# Patient Record
Sex: Female | Born: 1960
Health system: Southern US, Community
[De-identification: ages and names within clinical notes are randomized; demographics above are authoritative.]

## PROBLEM LIST (undated history)

## (undated) DIAGNOSIS — Z923 Personal history of irradiation: Secondary | ICD-10-CM

## (undated) DIAGNOSIS — Z9221 Personal history of antineoplastic chemotherapy: Secondary | ICD-10-CM

## (undated) DIAGNOSIS — L900 Lichen sclerosus et atrophicus: Secondary | ICD-10-CM

## (undated) DIAGNOSIS — C801 Malignant (primary) neoplasm, unspecified: Secondary | ICD-10-CM

## (undated) DIAGNOSIS — C50919 Malignant neoplasm of unspecified site of unspecified female breast: Secondary | ICD-10-CM

## (undated) DIAGNOSIS — Z1371 Encounter for nonprocreative screening for genetic disease carrier status: Secondary | ICD-10-CM

## (undated) HISTORY — DX: Malignant (primary) neoplasm, unspecified: C80.1

## (undated) HISTORY — DX: Encounter for nonprocreative screening for genetic disease carrier status: Z13.71

## (undated) HISTORY — DX: Lichen sclerosus et atrophicus: L90.0

## (undated) HISTORY — PX: BREAST LUMPECTOMY: SHX2

---

## 1998-08-25 ENCOUNTER — Other Ambulatory Visit: Admission: RE | Admit: 1998-08-25 | Discharge: 1998-08-25 | Payer: Self-pay | Admitting: Gynecology

## 1998-08-26 ENCOUNTER — Encounter: Admission: RE | Admit: 1998-08-26 | Discharge: 1998-11-24 | Payer: Self-pay | Admitting: Gynecology

## 2000-10-10 ENCOUNTER — Other Ambulatory Visit: Admission: RE | Admit: 2000-10-10 | Discharge: 2000-10-10 | Payer: Self-pay | Admitting: Gynecology

## 2002-07-08 ENCOUNTER — Other Ambulatory Visit: Admission: RE | Admit: 2002-07-08 | Discharge: 2002-07-08 | Payer: Self-pay | Admitting: Gynecology

## 2003-08-12 ENCOUNTER — Other Ambulatory Visit: Admission: RE | Admit: 2003-08-12 | Discharge: 2003-08-12 | Payer: Self-pay | Admitting: Gynecology

## 2004-01-11 HISTORY — PX: BREAST SURGERY: SHX581

## 2004-06-10 DIAGNOSIS — C801 Malignant (primary) neoplasm, unspecified: Secondary | ICD-10-CM

## 2004-06-10 HISTORY — DX: Malignant (primary) neoplasm, unspecified: C80.1

## 2004-07-01 ENCOUNTER — Encounter (INDEPENDENT_AMBULATORY_CARE_PROVIDER_SITE_OTHER): Payer: Self-pay | Admitting: Radiology

## 2004-07-01 ENCOUNTER — Encounter: Admission: RE | Admit: 2004-07-01 | Discharge: 2004-07-01 | Payer: Self-pay | Admitting: General Surgery

## 2004-07-01 ENCOUNTER — Encounter (INDEPENDENT_AMBULATORY_CARE_PROVIDER_SITE_OTHER): Payer: Self-pay | Admitting: *Deleted

## 2004-07-07 ENCOUNTER — Encounter: Admission: RE | Admit: 2004-07-07 | Discharge: 2004-07-07 | Payer: Self-pay | Admitting: General Surgery

## 2004-07-16 ENCOUNTER — Encounter: Admission: RE | Admit: 2004-07-16 | Discharge: 2004-07-16 | Payer: Self-pay | Admitting: General Surgery

## 2004-07-22 ENCOUNTER — Ambulatory Visit (HOSPITAL_COMMUNITY): Admission: RE | Admit: 2004-07-22 | Discharge: 2004-07-22 | Payer: Self-pay | Admitting: General Surgery

## 2004-07-22 ENCOUNTER — Ambulatory Visit: Payer: Self-pay | Admitting: Oncology

## 2004-07-22 ENCOUNTER — Encounter (INDEPENDENT_AMBULATORY_CARE_PROVIDER_SITE_OTHER): Payer: Self-pay | Admitting: *Deleted

## 2004-07-22 ENCOUNTER — Ambulatory Visit (HOSPITAL_BASED_OUTPATIENT_CLINIC_OR_DEPARTMENT_OTHER): Admission: RE | Admit: 2004-07-22 | Discharge: 2004-07-22 | Payer: Self-pay | Admitting: General Surgery

## 2004-08-09 ENCOUNTER — Ambulatory Visit (HOSPITAL_BASED_OUTPATIENT_CLINIC_OR_DEPARTMENT_OTHER): Admission: RE | Admit: 2004-08-09 | Discharge: 2004-08-10 | Payer: Self-pay | Admitting: General Surgery

## 2004-08-09 ENCOUNTER — Ambulatory Visit (HOSPITAL_COMMUNITY): Admission: RE | Admit: 2004-08-09 | Discharge: 2004-08-09 | Payer: Self-pay | Admitting: General Surgery

## 2004-08-09 ENCOUNTER — Encounter (INDEPENDENT_AMBULATORY_CARE_PROVIDER_SITE_OTHER): Payer: Self-pay | Admitting: Specialist

## 2004-08-31 ENCOUNTER — Other Ambulatory Visit: Admission: RE | Admit: 2004-08-31 | Discharge: 2004-08-31 | Payer: Self-pay | Admitting: Gynecology

## 2004-09-02 ENCOUNTER — Ambulatory Visit (HOSPITAL_COMMUNITY): Admission: RE | Admit: 2004-09-02 | Discharge: 2004-09-02 | Payer: Self-pay | Admitting: Oncology

## 2004-09-16 ENCOUNTER — Ambulatory Visit: Payer: Self-pay | Admitting: Oncology

## 2004-11-01 ENCOUNTER — Ambulatory Visit: Payer: Self-pay | Admitting: Oncology

## 2004-12-17 ENCOUNTER — Ambulatory Visit: Payer: Self-pay | Admitting: Oncology

## 2004-12-21 ENCOUNTER — Ambulatory Visit: Admission: RE | Admit: 2004-12-21 | Discharge: 2004-12-31 | Payer: Self-pay | Admitting: Radiation Oncology

## 2005-01-14 ENCOUNTER — Encounter: Admission: RE | Admit: 2005-01-14 | Discharge: 2005-01-14 | Payer: Self-pay | Admitting: Radiation Oncology

## 2005-01-19 ENCOUNTER — Ambulatory Visit: Admission: RE | Admit: 2005-01-19 | Discharge: 2005-03-23 | Payer: Self-pay | Admitting: Radiation Oncology

## 2005-01-25 ENCOUNTER — Ambulatory Visit (HOSPITAL_BASED_OUTPATIENT_CLINIC_OR_DEPARTMENT_OTHER): Admission: RE | Admit: 2005-01-25 | Discharge: 2005-01-25 | Payer: Self-pay | Admitting: Women's Health

## 2005-02-15 ENCOUNTER — Ambulatory Visit: Payer: Self-pay | Admitting: Oncology

## 2005-05-16 ENCOUNTER — Ambulatory Visit: Payer: Self-pay | Admitting: Oncology

## 2005-05-16 LAB — COMPREHENSIVE METABOLIC PANEL
ALT: 12 U/L (ref 0–40)
AST: 18 U/L (ref 0–37)
Creatinine, Ser: 0.9 mg/dL (ref 0.4–1.2)
Total Bilirubin: 0.4 mg/dL (ref 0.3–1.2)

## 2005-05-16 LAB — CBC & DIFF AND RETIC
BASO%: 0.2 % (ref 0.0–2.0)
EOS%: 1.5 % (ref 0.0–7.0)
IRF: 0.2 (ref 0.130–0.330)
MCH: 28.8 pg (ref 26.0–34.0)
MCHC: 33.9 g/dL (ref 32.0–36.0)
RBC: 4.17 10*6/uL (ref 3.70–5.32)
RDW: 14.9 % — ABNORMAL HIGH (ref 11.3–14.5)
RETIC #: 70.1 10*3/uL (ref 19.7–115.1)
lymph#: 1.3 10*3/uL (ref 0.9–3.3)

## 2005-05-16 LAB — CANCER ANTIGEN 27.29: CA 27.29: 10 U/mL (ref 0–39)

## 2005-05-16 LAB — ESTRADIOL: Estradiol: 28.2 pg/mL

## 2005-05-16 LAB — FOLLICLE STIMULATING HORMONE: FSH: 51.5 m[IU]/mL

## 2005-07-18 ENCOUNTER — Encounter: Admission: RE | Admit: 2005-07-18 | Discharge: 2005-07-18 | Payer: Self-pay | Admitting: Oncology

## 2005-07-21 ENCOUNTER — Encounter: Admission: RE | Admit: 2005-07-21 | Discharge: 2005-07-21 | Payer: Self-pay | Admitting: Oncology

## 2005-08-02 ENCOUNTER — Ambulatory Visit: Payer: Self-pay | Admitting: Oncology

## 2005-08-08 LAB — CBC WITH DIFFERENTIAL/PLATELET
Basophils Absolute: 0 10*3/uL (ref 0.0–0.1)
Eosinophils Absolute: 0.1 10*3/uL (ref 0.0–0.5)
HGB: 12.4 g/dL (ref 11.6–15.9)
MCV: 86 fL (ref 81.0–101.0)
MONO#: 0.3 10*3/uL (ref 0.1–0.9)
MONO%: 6.2 % (ref 0.0–13.0)
NEUT#: 2.9 10*3/uL (ref 1.5–6.5)
RDW: 12.4 % (ref 11.3–14.5)
lymph#: 1.3 10*3/uL (ref 0.9–3.3)

## 2005-08-13 LAB — COMPREHENSIVE METABOLIC PANEL
Albumin: 4.6 g/dL (ref 3.5–5.2)
BUN: 18 mg/dL (ref 6–23)
CO2: 28 mEq/L (ref 19–32)
Calcium: 9.6 mg/dL (ref 8.4–10.5)
Chloride: 104 mEq/L (ref 96–112)
Glucose, Bld: 92 mg/dL (ref 70–99)
Potassium: 4.3 mEq/L (ref 3.5–5.3)

## 2005-08-13 LAB — CANCER ANTIGEN 27.29: CA 27.29: 9 U/mL (ref 0–39)

## 2005-08-13 LAB — FOLLICLE STIMULATING HORMONE: FSH: 43.2 m[IU]/mL

## 2005-08-13 LAB — ESTRADIOL, ULTRA SENS: Estradiol, Ultra Sensitive: 51 pg/mL

## 2005-09-26 ENCOUNTER — Ambulatory Visit: Payer: Self-pay | Admitting: Oncology

## 2005-10-26 ENCOUNTER — Other Ambulatory Visit: Admission: RE | Admit: 2005-10-26 | Discharge: 2005-10-26 | Payer: Self-pay | Admitting: Gynecology

## 2005-11-10 ENCOUNTER — Ambulatory Visit: Payer: Self-pay | Admitting: Oncology

## 2005-11-14 LAB — COMPREHENSIVE METABOLIC PANEL
AST: 16 U/L (ref 0–37)
BUN: 17 mg/dL (ref 6–23)
Calcium: 8.7 mg/dL (ref 8.4–10.5)
Chloride: 104 mEq/L (ref 96–112)
Creatinine, Ser: 1.03 mg/dL (ref 0.40–1.20)
Glucose, Bld: 81 mg/dL (ref 70–99)

## 2005-11-14 LAB — CBC WITH DIFFERENTIAL/PLATELET
Basophils Absolute: 0 10*3/uL (ref 0.0–0.1)
EOS%: 1.4 % (ref 0.0–7.0)
Eosinophils Absolute: 0.1 10*3/uL (ref 0.0–0.5)
HCT: 35.7 % (ref 34.8–46.6)
HGB: 12.2 g/dL (ref 11.6–15.9)
MCH: 29.3 pg (ref 26.0–34.0)
MCV: 85.9 fL (ref 81.0–101.0)
NEUT%: 71.8 % (ref 39.6–76.8)
lymph#: 1.2 10*3/uL (ref 0.9–3.3)

## 2005-12-12 ENCOUNTER — Encounter: Admission: RE | Admit: 2005-12-12 | Discharge: 2005-12-12 | Payer: Self-pay | Admitting: Oncology

## 2006-02-10 ENCOUNTER — Ambulatory Visit: Payer: Self-pay | Admitting: Oncology

## 2006-02-15 LAB — CBC WITH DIFFERENTIAL/PLATELET
BASO%: 0.3 % (ref 0.0–2.0)
Basophils Absolute: 0 10*3/uL (ref 0.0–0.1)
Eosinophils Absolute: 0.1 10*3/uL (ref 0.0–0.5)
HCT: 34 % — ABNORMAL LOW (ref 34.8–46.6)
HGB: 11.8 g/dL (ref 11.6–15.9)
LYMPH%: 26.5 % (ref 14.0–48.0)
MCHC: 34.8 g/dL (ref 32.0–36.0)
MONO#: 0.3 10*3/uL (ref 0.1–0.9)
NEUT#: 3.4 10*3/uL (ref 1.5–6.5)
NEUT%: 66.9 % (ref 39.6–76.8)
Platelets: 264 10*3/uL (ref 145–400)
WBC: 5 10*3/uL (ref 3.9–10.0)
lymph#: 1.3 10*3/uL (ref 0.9–3.3)

## 2006-02-15 LAB — COMPREHENSIVE METABOLIC PANEL
AST: 14 U/L (ref 0–37)
Albumin: 4.2 g/dL (ref 3.5–5.2)
BUN: 16 mg/dL (ref 6–23)
CO2: 27 mEq/L (ref 19–32)
Calcium: 9.1 mg/dL (ref 8.4–10.5)
Chloride: 105 mEq/L (ref 96–112)
Creatinine, Ser: 0.91 mg/dL (ref 0.40–1.20)
Potassium: 4 mEq/L (ref 3.5–5.3)

## 2006-05-12 ENCOUNTER — Ambulatory Visit: Payer: Self-pay | Admitting: Oncology

## 2006-08-09 ENCOUNTER — Encounter: Admission: RE | Admit: 2006-08-09 | Discharge: 2006-08-09 | Payer: Self-pay | Admitting: Oncology

## 2006-08-10 ENCOUNTER — Ambulatory Visit: Payer: Self-pay | Admitting: Oncology

## 2006-08-15 LAB — CBC WITH DIFFERENTIAL/PLATELET
BASO%: 0.3 % (ref 0.0–2.0)
Basophils Absolute: 0 10*3/uL (ref 0.0–0.1)
EOS%: 1.2 % (ref 0.0–7.0)
HCT: 35.1 % (ref 34.8–46.6)
HGB: 12.2 g/dL (ref 11.6–15.9)
MCH: 29.2 pg (ref 26.0–34.0)
MCHC: 34.7 g/dL (ref 32.0–36.0)
MONO#: 0.4 10*3/uL (ref 0.1–0.9)
RDW: 12.9 % (ref 11.3–14.5)
WBC: 5.4 10*3/uL (ref 3.9–10.0)
lymph#: 1.4 10*3/uL (ref 0.9–3.3)

## 2006-08-15 LAB — COMPREHENSIVE METABOLIC PANEL
ALT: 10 U/L (ref 0–35)
AST: 13 U/L (ref 0–37)
Albumin: 4.3 g/dL (ref 3.5–5.2)
CO2: 26 mEq/L (ref 19–32)
Calcium: 8.8 mg/dL (ref 8.4–10.5)
Chloride: 105 mEq/L (ref 96–112)
Potassium: 4.2 mEq/L (ref 3.5–5.3)

## 2006-08-15 LAB — CANCER ANTIGEN 27.29: CA 27.29: 5 U/mL (ref 0–39)

## 2006-08-23 LAB — ESTRADIOL, ULTRA SENS: Estradiol, Ultra Sensitive: 9 pg/mL

## 2006-11-20 ENCOUNTER — Other Ambulatory Visit: Admission: RE | Admit: 2006-11-20 | Discharge: 2006-11-20 | Payer: Self-pay | Admitting: Gynecology

## 2006-12-15 ENCOUNTER — Encounter: Admission: RE | Admit: 2006-12-15 | Discharge: 2006-12-15 | Payer: Self-pay | Admitting: Oncology

## 2007-04-12 ENCOUNTER — Ambulatory Visit: Payer: Self-pay | Admitting: Oncology

## 2007-04-17 LAB — CBC WITH DIFFERENTIAL/PLATELET
BASO%: 0.5 % (ref 0.0–2.0)
EOS%: 1.1 % (ref 0.0–7.0)
HCT: 34.7 % — ABNORMAL LOW (ref 34.8–46.6)
LYMPH%: 26.8 % (ref 14.0–48.0)
MCH: 28.8 pg (ref 26.0–34.0)
MCHC: 34.1 g/dL (ref 32.0–36.0)
MCV: 84.3 fL (ref 81.0–101.0)
MONO#: 0.4 10*3/uL (ref 0.1–0.9)
MONO%: 5.9 % (ref 0.0–13.0)
NEUT%: 65.7 % (ref 39.6–76.8)
Platelets: 245 10*3/uL (ref 145–400)

## 2007-04-17 LAB — COMPREHENSIVE METABOLIC PANEL
ALT: 12 U/L (ref 0–35)
Alkaline Phosphatase: 60 U/L (ref 39–117)
CO2: 28 mEq/L (ref 19–32)
Creatinine, Ser: 1.02 mg/dL (ref 0.40–1.20)
Total Bilirubin: 0.3 mg/dL (ref 0.3–1.2)

## 2007-04-17 LAB — CANCER ANTIGEN 27.29: CA 27.29: 11 U/mL (ref 0–39)

## 2007-06-11 HISTORY — PX: COLONOSCOPY: SHX174

## 2007-10-16 ENCOUNTER — Ambulatory Visit: Payer: Self-pay | Admitting: Oncology

## 2007-10-18 LAB — CBC WITH DIFFERENTIAL/PLATELET
BASO%: 0.3 % (ref 0.0–2.0)
Basophils Absolute: 0 10e3/uL (ref 0.0–0.1)
EOS%: 1.2 % (ref 0.0–7.0)
Eosinophils Absolute: 0.1 10e3/uL (ref 0.0–0.5)
HCT: 35.9 % (ref 34.8–46.6)
HGB: 12.3 g/dL (ref 11.6–15.9)
LYMPH%: 26.4 % (ref 14.0–48.0)
MCH: 29.2 pg (ref 26.0–34.0)
MCHC: 34.4 g/dL (ref 32.0–36.0)
MCV: 84.9 fL (ref 81.0–101.0)
MONO#: 0.3 10e3/uL (ref 0.1–0.9)
MONO%: 5.4 % (ref 0.0–13.0)
NEUT#: 3.8 10e3/uL (ref 1.5–6.5)
NEUT%: 66.7 % (ref 39.6–76.8)
Platelets: 230 10e3/uL (ref 145–400)
RBC: 4.22 10e6/uL (ref 3.70–5.32)
RDW: 13 % (ref 11.3–14.5)
WBC: 5.7 10e3/uL (ref 3.9–10.0)
lymph#: 1.5 10e3/uL (ref 0.9–3.3)

## 2007-10-19 LAB — COMPREHENSIVE METABOLIC PANEL
CO2: 24 mEq/L (ref 19–32)
Calcium: 8.9 mg/dL (ref 8.4–10.5)
Creatinine, Ser: 1.19 mg/dL (ref 0.40–1.20)
Glucose, Bld: 102 mg/dL — ABNORMAL HIGH (ref 70–99)
Sodium: 139 mEq/L (ref 135–145)
Total Bilirubin: 0.2 mg/dL — ABNORMAL LOW (ref 0.3–1.2)
Total Protein: 6.8 g/dL (ref 6.0–8.3)

## 2007-10-19 LAB — FOLLICLE STIMULATING HORMONE: FSH: 25.1 m[IU]/mL

## 2007-10-19 LAB — VITAMIN D 25 HYDROXY (VIT D DEFICIENCY, FRACTURES): Vit D, 25-Hydroxy: 33 ng/mL (ref 30–89)

## 2007-10-19 LAB — CANCER ANTIGEN 27.29: CA 27.29: 8 U/mL (ref 0–39)

## 2007-10-28 LAB — ESTRADIOL, ULTRA SENS

## 2008-01-15 ENCOUNTER — Encounter: Admission: RE | Admit: 2008-01-15 | Discharge: 2008-01-15 | Payer: Self-pay | Admitting: Oncology

## 2008-01-21 ENCOUNTER — Encounter: Admission: RE | Admit: 2008-01-21 | Discharge: 2008-01-21 | Payer: Self-pay | Admitting: Oncology

## 2008-01-23 ENCOUNTER — Ambulatory Visit: Payer: Self-pay | Admitting: Gynecology

## 2008-01-23 ENCOUNTER — Other Ambulatory Visit: Admission: RE | Admit: 2008-01-23 | Discharge: 2008-01-23 | Payer: Self-pay | Admitting: Gynecology

## 2008-01-23 ENCOUNTER — Encounter: Payer: Self-pay | Admitting: Gynecology

## 2008-01-30 ENCOUNTER — Ambulatory Visit: Payer: Self-pay | Admitting: Gynecology

## 2008-10-10 ENCOUNTER — Ambulatory Visit: Payer: Self-pay | Admitting: Oncology

## 2008-10-14 LAB — COMPREHENSIVE METABOLIC PANEL
Alkaline Phosphatase: 57 U/L (ref 39–117)
BUN: 13 mg/dL (ref 6–23)
Glucose, Bld: 91 mg/dL (ref 70–99)
Total Bilirubin: 0.5 mg/dL (ref 0.3–1.2)

## 2008-10-14 LAB — CBC WITH DIFFERENTIAL/PLATELET
EOS%: 1.3 % (ref 0.0–7.0)
MCH: 29.2 pg (ref 25.1–34.0)
MCHC: 34.2 g/dL (ref 31.5–36.0)
MCV: 85.6 fL (ref 79.5–101.0)
MONO%: 6.1 % (ref 0.0–14.0)
RBC: 4.16 10*6/uL (ref 3.70–5.45)
RDW: 12.8 % (ref 11.2–14.5)

## 2008-11-04 ENCOUNTER — Ambulatory Visit: Payer: Self-pay | Admitting: Gynecology

## 2009-02-06 ENCOUNTER — Ambulatory Visit: Payer: Self-pay | Admitting: Gynecology

## 2009-02-06 ENCOUNTER — Other Ambulatory Visit: Admission: RE | Admit: 2009-02-06 | Discharge: 2009-02-06 | Payer: Self-pay | Admitting: Gynecology

## 2009-02-06 ENCOUNTER — Encounter: Admission: RE | Admit: 2009-02-06 | Discharge: 2009-02-06 | Payer: Self-pay | Admitting: Gynecology

## 2009-10-15 ENCOUNTER — Ambulatory Visit: Payer: Self-pay | Admitting: Oncology

## 2009-10-20 LAB — COMPREHENSIVE METABOLIC PANEL
AST: 22 U/L (ref 0–37)
BUN: 17 mg/dL (ref 6–23)
Calcium: 9 mg/dL (ref 8.4–10.5)
Chloride: 103 mEq/L (ref 96–112)
Creatinine, Ser: 1.47 mg/dL — ABNORMAL HIGH (ref 0.40–1.20)
Glucose, Bld: 86 mg/dL (ref 70–99)

## 2009-10-20 LAB — CBC WITH DIFFERENTIAL/PLATELET
Basophils Absolute: 0 10*3/uL (ref 0.0–0.1)
EOS%: 1.2 % (ref 0.0–7.0)
HCT: 36.7 % (ref 34.8–46.6)
HGB: 12.4 g/dL (ref 11.6–15.9)
MCH: 29.1 pg (ref 25.1–34.0)
MCV: 86 fL (ref 79.5–101.0)
MONO%: 4.8 % (ref 0.0–14.0)
NEUT%: 63.4 % (ref 38.4–76.8)
lymph#: 2 10*3/uL (ref 0.9–3.3)

## 2009-10-21 LAB — VITAMIN D 25 HYDROXY (VIT D DEFICIENCY, FRACTURES): Vit D, 25-Hydroxy: 37 ng/mL (ref 30–89)

## 2009-12-21 ENCOUNTER — Ambulatory Visit: Payer: Self-pay | Admitting: Gynecology

## 2010-01-31 ENCOUNTER — Encounter: Payer: Self-pay | Admitting: Oncology

## 2010-03-02 ENCOUNTER — Other Ambulatory Visit: Payer: Self-pay | Admitting: Oncology

## 2010-03-02 ENCOUNTER — Encounter (HOSPITAL_BASED_OUTPATIENT_CLINIC_OR_DEPARTMENT_OTHER): Payer: 59 | Admitting: Oncology

## 2010-03-02 ENCOUNTER — Other Ambulatory Visit: Payer: Self-pay | Admitting: Gynecology

## 2010-03-02 DIAGNOSIS — Z9889 Other specified postprocedural states: Secondary | ICD-10-CM

## 2010-03-02 DIAGNOSIS — C773 Secondary and unspecified malignant neoplasm of axilla and upper limb lymph nodes: Secondary | ICD-10-CM

## 2010-03-02 DIAGNOSIS — Z170 Estrogen receptor positive status [ER+]: Secondary | ICD-10-CM

## 2010-03-02 DIAGNOSIS — C50419 Malignant neoplasm of upper-outer quadrant of unspecified female breast: Secondary | ICD-10-CM

## 2010-03-02 LAB — CANCER ANTIGEN 27.29: CA 27.29: 9 U/mL (ref 0–39)

## 2010-03-02 LAB — CBC WITH DIFFERENTIAL/PLATELET
BASO%: 0.3 % (ref 0.0–2.0)
Eosinophils Absolute: 0.1 10*3/uL (ref 0.0–0.5)
LYMPH%: 31.3 % (ref 14.0–49.7)
MONO#: 0.4 10*3/uL (ref 0.1–0.9)
NEUT#: 4 10*3/uL (ref 1.5–6.5)
Platelets: 248 10*3/uL (ref 145–400)
RBC: 4.13 10*6/uL (ref 3.70–5.45)
RDW: 13.1 % (ref 11.2–14.5)
WBC: 6.6 10*3/uL (ref 3.9–10.3)
lymph#: 2.1 10*3/uL (ref 0.9–3.3)

## 2010-03-02 LAB — COMPREHENSIVE METABOLIC PANEL
ALT: 17 U/L (ref 0–35)
Albumin: 4.1 g/dL (ref 3.5–5.2)
CO2: 31 mEq/L (ref 19–32)
Chloride: 100 mEq/L (ref 96–112)
Glucose, Bld: 95 mg/dL (ref 70–99)
Potassium: 3.6 mEq/L (ref 3.5–5.3)
Sodium: 138 mEq/L (ref 135–145)
Total Protein: 7.1 g/dL (ref 6.0–8.3)

## 2010-03-02 LAB — FOLLICLE STIMULATING HORMONE: FSH: 30.2 m[IU]/mL

## 2010-03-03 ENCOUNTER — Other Ambulatory Visit (HOSPITAL_COMMUNITY)
Admission: RE | Admit: 2010-03-03 | Discharge: 2010-03-03 | Disposition: A | Discharge status: Home / Self Care | Payer: 59 | Source: Ambulatory Visit | Attending: Gynecology | Admitting: Gynecology

## 2010-03-03 ENCOUNTER — Encounter (INDEPENDENT_AMBULATORY_CARE_PROVIDER_SITE_OTHER): Payer: 59 | Admitting: Gynecology

## 2010-03-03 ENCOUNTER — Other Ambulatory Visit: Payer: Self-pay | Admitting: Gynecology

## 2010-03-03 DIAGNOSIS — Z124 Encounter for screening for malignant neoplasm of cervix: Secondary | ICD-10-CM | POA: Insufficient documentation

## 2010-03-03 DIAGNOSIS — Z1211 Encounter for screening for malignant neoplasm of colon: Secondary | ICD-10-CM

## 2010-03-03 DIAGNOSIS — Z01419 Encounter for gynecological examination (general) (routine) without abnormal findings: Secondary | ICD-10-CM

## 2010-03-05 ENCOUNTER — Ambulatory Visit
Admission: RE | Admit: 2010-03-05 | Discharge: 2010-03-05 | Disposition: A | Discharge status: Home / Self Care | Payer: 59 | Source: Ambulatory Visit | Attending: Gynecology | Admitting: Gynecology

## 2010-03-05 DIAGNOSIS — Z9889 Other specified postprocedural states: Secondary | ICD-10-CM

## 2010-03-08 ENCOUNTER — Other Ambulatory Visit: Payer: Self-pay | Admitting: Oncology

## 2010-03-08 DIAGNOSIS — C50919 Malignant neoplasm of unspecified site of unspecified female breast: Secondary | ICD-10-CM

## 2010-03-15 ENCOUNTER — Other Ambulatory Visit (HOSPITAL_COMMUNITY): Payer: 59

## 2010-03-16 ENCOUNTER — Encounter (HOSPITAL_BASED_OUTPATIENT_CLINIC_OR_DEPARTMENT_OTHER): Payer: 59 | Admitting: Oncology

## 2010-03-16 DIAGNOSIS — C773 Secondary and unspecified malignant neoplasm of axilla and upper limb lymph nodes: Secondary | ICD-10-CM

## 2010-03-16 DIAGNOSIS — C50419 Malignant neoplasm of upper-outer quadrant of unspecified female breast: Secondary | ICD-10-CM

## 2010-03-16 DIAGNOSIS — Z170 Estrogen receptor positive status [ER+]: Secondary | ICD-10-CM

## 2010-05-28 NOTE — Op Note (Signed)
Cassandra Dickson, Cassandra Dickson                 ACCOUNT NO.:  [id number]   MEDICAL RECORD NO.:  [id number]          PATIENT TYPE:  AMB   LOCATION:  DSC                          FACILITY:  [facility]   PHYSICIAN:  Rose Phi. Maple Hudson, M.D.   DATE OF BIRTH:  02-02-1960   DATE OF PROCEDURE:  07/22/2004  DATE OF DISCHARGE:                                 OPERATIVE REPORT   PREOPERATIVE DIAGNOSIS:  Stage I carcinoma of the left breast.   POSTOPERATIVE DIAGNOSIS:  Stage I carcinoma of the left breast.   OPERATION:  1.  Blue dye injection.  2.  Left axillary sentinel lymph node biopsy.  3.  Left partial mastectomy.   SURGEON:  Rose Phi. Maple Hudson, M.D.   ANESTHESIA:  General.   OPERATIVE PROCEDURE:  Prior to coming into the operating room. 1 mCi of  technetium sulfur colloid was injected intradermally.   After suitable general anesthesia was induced, the patient was placed in the  supine position with the arms extended on the arm board.  Five milliliters  of a mixture of 2 mL of methylene blue and 3 mL of injectable saline was  injected the subareolar breast tissue and the breast gently massaged for  three minutes.  We then prepped and draped in a standard fashion.   A short transverse left axillary incision was made with dissection through  subcutaneous tissue to the clavipectoral fascia.  Just deep to the fascia  was a somewhat enlarged blue and hot lymph node with counts in excess of  2000.  I excised and that node and submitted it as a sentinel node.  There  were no other palpable blue or hot nodes.   I then turned my attention to the palpable nodule in the upper outer  quadrant of her left breast.  An elliptical incision, curved in nature,  including an ellipse of skin, was then outlined over the palpable mass and  the incision made and a wide excision of the mass and surrounding tissue was  carried out.  Hemostasis obtained with the cautery.  The specimen was oriented for the  pathologist and  submitted for evaluation for margins.   The sentinel node was reported as negative for metastatic disease, and the  margins were reported as clean with at least a 5 mm margin laterally.   Both incisions were then injected with Marcaine.  It was then closed in two  layers with 3-0 Vicryl and subcuticular 4-0 Monocryl and Steri-Strips.   Dressings were applied and the patient transferred to the recovery room in  satisfactory condition, having tolerated procedure well.       PRY/MEDQ  D:  07/22/2004  T:  07/22/2004  Job:  [id number]

## 2010-05-28 NOTE — Op Note (Signed)
Cassandra Dickson, Cassandra Dickson                 ACCOUNT NO.:  [id number]   MEDICAL RECORD NO.:  [id number]          PATIENT TYPE:  AMB   LOCATION:  DSC                          FACILITY:  [facility]   PHYSICIAN:  Rose Phi. Maple Hudson, M.D.   DATE OF BIRTH:  03/04/1960   DATE OF PROCEDURE:  01/25/2005  DATE OF DISCHARGE:                                 OPERATIVE REPORT   PREOPERATIVE DIAGNOSIS:  Carcinoma of the left breast.   POSTOPERATIVE DIAGNOSIS:  Carcinoma of the left breast.   OPERATION:  Removal of Port-A-Cath.   SURGEON:  Rose Phi. Maple Hudson, M.D.   ANESTHESIA:  Local.   OPERATIVE PROCEDURE:  The patient was placed on the operating table with her  arms by her side and the right upper chest prepped and draped in the usual  fashion. Under local anaesthesia, the incision over the pocket was then  incised and we dissected down and exposed the port. The catheter was  identified, grasped, and removed. With traction on the catheter, the port  was elevated and the two sutures holding it in place were divided and the  entire system removed.   There was no bleeding, so the incision was closed with subcuticular 4-0  Monocryl and Steri-Strips. Dressing applied. The patient was then allowed go  to home.      Rose Phi. Maple Hudson, M.D.  Electronically Signed     PRY/MEDQ  D:  01/25/2005  T:  01/25/2005  Job:  [id number]

## 2010-05-28 NOTE — Op Note (Signed)
Cassandra Dickson, Cassandra Dickson                 ACCOUNT NO.:  [id number]   MEDICAL RECORD NO.:  [id number]          PATIENT TYPE:  AMB   LOCATION:  DSC                          FACILITY:  [facility]   PHYSICIAN:  Rose Phi. Maple Hudson, M.D.   DATE OF BIRTH:  04-11-1960   DATE OF PROCEDURE:  08/09/2004  DATE OF DISCHARGE:                                 OPERATIVE REPORT   PREOPERATIVE DIAGNOSIS:  Stage II carcinoma of the left breast.   POSTOPERATIVE DIAGNOSIS:  Stage II carcinoma of the left breast.   OPERATION:  1.  Left axillary lymph node dissection.  2.  Insertion of a Port-A-Cath under flouroscopic control   SURGEON:  Rose Phi. Maple Hudson, M.D.   ANESTHESIA:  General.   OPERATIVE PROCEDURE:  This patient had previously undergone a left partial  mastectomy and sentinel node biopsy for a stage I carcinoma of the left  breast.  On permanent sections, small micrometastasis was identified in the  sentinel node which had originally been interpreted as negative.  Because of  her age and the presence of lymphovascular invasion on the specimen, it was  felt that completion axillary node dissection would be important.   After suitable general anesthesia was induced, the patient was placed in a  supine position with the arms extended on the arm board.  The left breast  and axilla were prepped and draped in the usual fashion.   A transverse axillary incision was then made, incorporating the previous  sentinel node incision and the axilla entered.  I dissected along the  pectoralis major muscle, retracting it and exposing the pectoralis minor.  I  identified the clavipectoral fascia at the level of axillary vein and then  freed that up and then dissected all the contents from inferior to the vein  and from behind the pectoralis minor, sweeping out the axillary content at  level I and level II.   The long thoracic and thoracodorsal nerves were identified and preserved and  other vessels and nerves were clipped  and divided.   At the completion of the dissection, a 19-French Blake drain was inserted  and brought out a separate stab wound.  The subcutaneous tissue was closed  with 3-0 Vicryl and the skin with staples.   We then repositioned the patient with the arms at the side and reprepped and  draped the right upper chest.   A right subclavian puncture was carried out without difficulty and the  guidewire inserted, and proper positioning of the guidewire confirmed on  fluoroscopy.   We then made an incision on the anterior chest wall and developed a pocket  for the implantable port.  I tunneled between the subclavian puncture site  and the port and passed the catheter through that, then attached it to the  Bard export and placed it in the pocket.  I measured out the length of the  catheter to go to the 4th interspace and divided it there and then passed  the dilator and peel-away sheath over the wire.  The wire was removed  followed by the dilator and  we passed the catheter through the peel-a-away  sheath and then removed the sheath.   Again, fluoroscopy was used to confirm that we had the catheter tip in the  superior vena cava at the proper level and that there was no kinking in the  system.   Incisions were closed with 3-0 Vicryl and subcuticular 4-0 Monocryl and  Steri-Strips.   We then accessed it with a Demetrios Isaacs point needle and aspirated and then  thoroughly heparinized the system.   The needle was removed and dressings were applied, and the patient  transferred to the recovery room in satisfactory condition, having tolerated  the procedure well.       PRY/MEDQ  D:  08/09/2004  T:  08/10/2004  Job:  [id number]

## 2010-07-22 ENCOUNTER — Encounter (HOSPITAL_BASED_OUTPATIENT_CLINIC_OR_DEPARTMENT_OTHER): Payer: 59 | Admitting: Oncology

## 2010-07-22 ENCOUNTER — Other Ambulatory Visit: Payer: Self-pay | Admitting: Oncology

## 2010-07-22 DIAGNOSIS — Z170 Estrogen receptor positive status [ER+]: Secondary | ICD-10-CM

## 2010-07-22 DIAGNOSIS — C50419 Malignant neoplasm of upper-outer quadrant of unspecified female breast: Secondary | ICD-10-CM

## 2010-07-22 DIAGNOSIS — C773 Secondary and unspecified malignant neoplasm of axilla and upper limb lymph nodes: Secondary | ICD-10-CM

## 2010-07-22 LAB — CBC WITH DIFFERENTIAL/PLATELET
Basophils Absolute: 0 10*3/uL (ref 0.0–0.1)
EOS%: 1.6 % (ref 0.0–7.0)
Eosinophils Absolute: 0.1 10*3/uL (ref 0.0–0.5)
HCT: 36.1 % (ref 34.8–46.6)
HGB: 12.1 g/dL (ref 11.6–15.9)
MCH: 28.4 pg (ref 25.1–34.0)
MCV: 84.6 fL (ref 79.5–101.0)
MONO%: 6.7 % (ref 0.0–14.0)
NEUT#: 3.1 10*3/uL (ref 1.5–6.5)
NEUT%: 59.5 % (ref 38.4–76.8)
lymph#: 1.7 10*3/uL (ref 0.9–3.3)

## 2010-07-22 LAB — COMPREHENSIVE METABOLIC PANEL
AST: 19 U/L (ref 0–37)
Albumin: 4.1 g/dL (ref 3.5–5.2)
BUN: 20 mg/dL (ref 6–23)
Calcium: 10.1 mg/dL (ref 8.4–10.5)
Chloride: 99 mEq/L (ref 96–112)
Creatinine, Ser: 0.86 mg/dL (ref 0.50–1.10)
Glucose, Bld: 96 mg/dL (ref 70–99)
Potassium: 4.5 mEq/L (ref 3.5–5.3)

## 2010-07-22 LAB — LUTEINIZING HORMONE: LH: 36 m[IU]/mL

## 2010-07-22 LAB — LIPID PANEL
HDL: 59 mg/dL (ref >39)
LDL Cholesterol: 83 mg/dL (ref 0–99)
Total CHOL/HDL Ratio: 2.8 Ratio
VLDL: 23 mg/dL (ref 0–40)

## 2010-07-22 LAB — FOLLICLE STIMULATING HORMONE: FSH: 45.9 m[IU]/mL

## 2010-07-29 LAB — ESTRADIOL, ULTRA SENS: Estradiol, Ultra Sensitive: <2 pg/mL

## 2010-08-25 ENCOUNTER — Encounter (HOSPITAL_BASED_OUTPATIENT_CLINIC_OR_DEPARTMENT_OTHER): Payer: 59 | Admitting: Oncology

## 2010-08-25 DIAGNOSIS — C773 Secondary and unspecified malignant neoplasm of axilla and upper limb lymph nodes: Secondary | ICD-10-CM

## 2010-08-25 DIAGNOSIS — Z170 Estrogen receptor positive status [ER+]: Secondary | ICD-10-CM

## 2010-08-25 DIAGNOSIS — C50419 Malignant neoplasm of upper-outer quadrant of unspecified female breast: Secondary | ICD-10-CM

## 2010-12-22 ENCOUNTER — Telehealth: Payer: Self-pay | Admitting: *Deleted

## 2010-12-22 NOTE — Telephone Encounter (Signed)
She finished tamoxifen and is now taking letrozole.  She would like to go back to the tamoxifen as she has joints aches with the letrozole.  Would Dr. Darnelle Catalan see what he thinks about this.   Please call her at work number  [phone]

## 2010-12-23 ENCOUNTER — Other Ambulatory Visit: Payer: Self-pay

## 2010-12-23 DIAGNOSIS — C50919 Malignant neoplasm of unspecified site of unspecified female breast: Secondary | ICD-10-CM

## 2010-12-23 MED ORDER — TAMOXIFEN CITRATE 20 MG PO TABS: 20.0000 mg | ORAL_TABLET | Freq: Every day | ORAL | Status: DC

## 2011-02-22 ENCOUNTER — Other Ambulatory Visit: Payer: Self-pay | Admitting: Gynecology

## 2011-02-23 ENCOUNTER — Other Ambulatory Visit: Payer: Self-pay | Admitting: *Deleted

## 2011-02-23 DIAGNOSIS — Z853 Personal history of malignant neoplasm of breast: Secondary | ICD-10-CM

## 2011-02-28 ENCOUNTER — Other Ambulatory Visit: Payer: Self-pay | Admitting: Oncology

## 2011-02-28 ENCOUNTER — Other Ambulatory Visit (HOSPITAL_BASED_OUTPATIENT_CLINIC_OR_DEPARTMENT_OTHER): Payer: 59 | Admitting: Lab

## 2011-02-28 DIAGNOSIS — E559 Vitamin D deficiency, unspecified: Secondary | ICD-10-CM

## 2011-02-28 DIAGNOSIS — C50919 Malignant neoplasm of unspecified site of unspecified female breast: Secondary | ICD-10-CM

## 2011-02-28 LAB — CBC WITH DIFFERENTIAL/PLATELET
BASO%: 0.4 % (ref 0.0–2.0)
Eosinophils Absolute: 0.1 10*3/uL (ref 0.0–0.5)
MCHC: 33.7 g/dL (ref 31.5–36.0)
MONO#: 0.4 10*3/uL (ref 0.1–0.9)
NEUT#: 3.7 10*3/uL (ref 1.5–6.5)
Platelets: 246 10*3/uL (ref 145–400)
RBC: 4.07 10*6/uL (ref 3.70–5.45)
RDW: 14.3 % (ref 11.2–14.5)
WBC: 6.2 10*3/uL (ref 3.9–10.3)
lymph#: 2.1 10*3/uL (ref 0.9–3.3)

## 2011-02-28 LAB — COMPREHENSIVE METABOLIC PANEL
ALT: 11 U/L (ref 0–35)
Albumin: 3.7 g/dL (ref 3.5–5.2)
CO2: 26 mEq/L (ref 19–32)
Chloride: 105 mEq/L (ref 96–112)
Glucose, Bld: 102 mg/dL — ABNORMAL HIGH (ref 70–99)
Potassium: 4.1 mEq/L (ref 3.5–5.3)
Sodium: 139 mEq/L (ref 135–145)
Total Bilirubin: 0.1 mg/dL — ABNORMAL LOW (ref 0.3–1.2)
Total Protein: 7.2 g/dL (ref 6.0–8.3)

## 2011-03-01 LAB — CANCER ANTIGEN 27.29: CA 27.29: 9 U/mL (ref 0–39)

## 2011-03-07 ENCOUNTER — Ambulatory Visit (INDEPENDENT_AMBULATORY_CARE_PROVIDER_SITE_OTHER): Payer: 59 | Admitting: Gynecology

## 2011-03-07 ENCOUNTER — Ambulatory Visit (INDEPENDENT_AMBULATORY_CARE_PROVIDER_SITE_OTHER): Payer: 59

## 2011-03-07 ENCOUNTER — Ambulatory Visit: Payer: 59 | Admitting: Oncology

## 2011-03-07 ENCOUNTER — Other Ambulatory Visit (HOSPITAL_COMMUNITY)
Admission: RE | Admit: 2011-03-07 | Discharge: 2011-03-07 | Disposition: A | Discharge status: Home / Self Care | Payer: 59 | Source: Ambulatory Visit | Attending: Gynecology | Admitting: Gynecology

## 2011-03-07 ENCOUNTER — Encounter: Payer: Self-pay | Admitting: Gynecology

## 2011-03-07 ENCOUNTER — Ambulatory Visit
Admission: RE | Admit: 2011-03-07 | Discharge: 2011-03-07 | Disposition: A | Discharge status: Home / Self Care | Payer: 59 | Source: Ambulatory Visit | Attending: Gynecology | Admitting: Gynecology

## 2011-03-07 VITALS — BP 126/78 | Ht 63.75 in | Wt 157.0 lb

## 2011-03-07 DIAGNOSIS — Z853 Personal history of malignant neoplasm of breast: Secondary | ICD-10-CM

## 2011-03-07 DIAGNOSIS — N83209 Unspecified ovarian cyst, unspecified side: Secondary | ICD-10-CM

## 2011-03-07 DIAGNOSIS — Z0000 Encounter for general adult medical examination without abnormal findings: Secondary | ICD-10-CM

## 2011-03-07 DIAGNOSIS — Z01419 Encounter for gynecological examination (general) (routine) without abnormal findings: Secondary | ICD-10-CM

## 2011-03-07 DIAGNOSIS — N8300 Follicular cyst of ovary, unspecified side: Secondary | ICD-10-CM

## 2011-03-07 NOTE — Progress Notes (Signed)
Cassandra Dickson 23-Jun-1960 [id number]   History:    51 y.o.  for annual exam with no complaints today.  . Patient with history of left breast cancer poorly differentiated infiltrating ductal carcinoma status post lumpectomy and chemotherapy and radiation therapy in 2006. Patient currently on extended tamoxifen as per oncologist. Patient denied any bleeding. In 2012 she had an endometrial biopsy and sonohysterogram as a result of some mild bleeding and benign endometrial polyps were detected. She has had no further bleeding. Last bone density study was normal in 2010 her colonoscopy was in 2009 and cecal polyp was detected. Patient's taking her calcium and vitamin D daily. Review of her record indicated that in December 2011 she had a left ovarian cyst measuring 3.2 x 2.0 x 2.0 cm. Patient with past history of gestational diabetes.  Past medical history,surgical history, family history and social history were all reviewed and documented in the [facility]  Gynecologic History Patient's last menstrual period was 03/06/2004. Contraception: condoms Last Pap: 2012. Results were:normal} Last mammogram: Today results pending. Results were: Results pending  Obstetric History OB History    Grav Para Term Preterm Abortions TAB SAB Ect Mult Living   1 1  1      1      # Outc Date GA Lbr Len/2nd Wgt Sex Del Anes PTL Lv   1 PRE     M SVD  Yes Yes       ROS:  Was performed and pertinent positives and negatives are included in the history.  Exam: chaperone present  BP 126/78  Ht 5' 3.75" (1.619 m)  Wt 157 lb (71.215 kg)  BMI 27.16 kg/m2  LMP 03/06/2004  Body mass index is 27.16 kg/(m^2).  General appearance : Well developed well nourished female. No acute distress HEENT: Neck supple, trachea midline, no carotid bruits, no thyroidmegaly Lungs: Clear to auscultation, no rhonchi or wheezes, or rib retractions  Heart: Regular rate and rhythm, no murmurs or gallops Breast:Examined in sitting and  supine position were symmetrical in appearance, no palpable masses or tenderness,  no skin retraction, no nipple inversion, no nipple discharge, no skin discoloration, no axillary or supraclavicular lymphadenopathy Abdomen: no palpable masses or tenderness, no rebound or guarding Extremities: no edema or skin discoloration or tenderness  Pelvic:  Bartholin, Urethra, Skene Glands: Within normal limits             Vagina: No gross lesions or discharge  Cervix: No gross lesions or discharge  Uterus  anteverted, normal size, shape and consistency, non-tender and mobile  Adnexa  Without masses or tenderness  Anus and perineum  normal   Rectovaginal  normal sphincter tone without palpated masses or tenderness             Hemoccult not done     Assessment/Plan:  51 y.o. female for annual exam with history of left breast cancer poorly differentiated infiltrating ductal carcinoma status post lumpectomy and chemotherapy and radiation therapy in 2006. Patient currently on extended tamoxifen as per oncologist. Patient doing well. Ultrasound was done today for followup: complete resolution of previous seen ovarian cyst endometrial stripe 1.7 mm. Uterus measures 6.3 x 4.0 x 2.8 cm. No abnormalities noted. Blood work done recently by [employer] oncologist. Pap smear done today. Patient instructed take her calcium vitamin D daily. She will schedule a bone density study here in the office in the next couple weeks. Patient instructed to continue to do her monthly self breast examinations.   Ok Edwards  MD, 5:51 PM 03/07/2011

## 2011-03-08 ENCOUNTER — Other Ambulatory Visit: Payer: Self-pay | Admitting: Gynecology

## 2011-03-08 DIAGNOSIS — Z853 Personal history of malignant neoplasm of breast: Secondary | ICD-10-CM

## 2011-03-08 DIAGNOSIS — Z13820 Encounter for screening for osteoporosis: Secondary | ICD-10-CM

## 2011-03-09 ENCOUNTER — Other Ambulatory Visit: Payer: Self-pay

## 2011-03-09 ENCOUNTER — Ambulatory Visit (HOSPITAL_BASED_OUTPATIENT_CLINIC_OR_DEPARTMENT_OTHER): Payer: 59 | Admitting: Oncology

## 2011-03-09 ENCOUNTER — Telehealth: Payer: Self-pay | Admitting: *Deleted

## 2011-03-09 VITALS — BP 112/76 | HR 69 | Temp 98.0°F | Ht 63.5 in | Wt 156.6 lb

## 2011-03-09 DIAGNOSIS — Z853 Personal history of malignant neoplasm of breast: Secondary | ICD-10-CM

## 2011-03-09 MED ORDER — FLUCONAZOLE 150 MG PO TABS: 150.0000 mg | ORAL_TABLET | Freq: Once | ORAL | Status: AC

## 2011-03-09 NOTE — Progress Notes (Signed)
ID: Sherrine Maples   DOB: 05/13/1960  MR#: [id number]  FAO#:[id number]  HISTORY OF PRESENT ILLNESS: The patient herself palpated a mass in her left breast.  She was evaluated for this at [facility] with mammograms and ultrasound (June 21, 2004).  This showed a solid hypoechoic mass in the left breast, measuring approximately 11-mm by ultrasound.  The patient was referred to the [facility] for a biopsy under ultrasound guidance and this was performed July 01, 2004.  It showed ([id number] and [id number]) a poorly- differentiated infiltrating ductal carcinoma, strongly ER positive and PR positive, Hercept test 2+, negative by FISH.    With this information, the patient was referred to Dr. Maple Hudson and a breast MRI was obtained showing a solitary lesion in the left breast.  Accordingly, on July 22, 2004 the patient underwent a left lumpectomy and sentinel lymph node biopsy under Francina Ames.  The final pathology ([id number]) confirmed the presence of a grade 3, infiltrating ductal carcinoma measuring 1.7-cm.  There was evidence of lymphovascular invasion.  The margins were negative.  The single sentinel lymph node showed a micrometastatic focus measuring approximately 1/3 of a millimeter. The cells showed evidence of mitotic activity and therefore were read as a micrometastatic deposit, not as isolated tumor cells.  However H&E was negative.   INTERVAL HISTORY: The interval history is generally unremarkable. [name] continues to work full-time. She tells me her son is taking some advanced classes at [employer]. Since she went back on the tamoxifen her "life is much better" and this is discussed further below.  REVIEW OF SYSTEMS: She did develop the arthralgias/myalgias that can be experienced with aromatase inhibitors, leading her to discontinue that. Although symptoms have resolved. The only problem she has right now is some discomfort in her right shoulder. This is in the deltoid area and slightly laterally. It  is related to her weight lifting, she feels. Detailed review of systems was otherwise entirely negative.  PAST MEDICAL HISTORY: Past Medical History  Diagnosis Date  . Diabetes mellitus     GESTATIONAL  . NSVD (normal spontaneous vaginal delivery)   . BRCA1 negative   . BRCA2 negative   . Cancer 06/2004    LEFT BREAST CANCER.[facility] RADIATION / CHEMO    PAST SURGICAL HISTORY: Past Surgical History  Procedure Date  . Colonoscopy 06/2007    BENIGN CECAL POLYP   . Breast surgery 2006    LEFT LUMPECTOMY    FAMILY HISTORY Family History  Problem Relation Age of Onset  . Cancer Father     LUNG  . Hypertension Maternal Aunt   . Diabetes Maternal Uncle   . Breast cancer Maternal Grandmother   [name] died from small-cell lung cancer at the age of 25.  He was a smoker.  The patient's mother is alive, in her 66's.  The patient has one brother and no sisters.  One of her mother's sisters and her mother's mother had a diagnosis of breast cancer, the  maternal aunt, approximately at age 78, her maternal grandmother in her old age.  There is no history of ovarian cancer in the family.   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  She lives with her son, [place], currently at [employer]  The patient is a Control and instrumentation engineer.     ADVANCED DIRECTIVES:  HEALTH MAINTENANCE: History  Substance Use Topics  . Smoking status: Never Smoker   .  Smokeless tobacco: Never Used  . Alcohol Use: Yes     WINE      Colonoscopy:  PAP:  Bone density:  Lipid panel:  No Known Allergies  Current Outpatient Prescriptions  Medication Sig Dispense Refill  . calcium carbonate (OS-CAL) 600 MG TABS Take 600 mg by mouth 2 (two) times daily with a meal.      . cholecalciferol (VITAMIN D) 1000 UNITS tablet Take 1,000 Units by mouth daily.      . ferrous sulfate 325 (65 FE) MG tablet Take  325 mg by mouth daily with breakfast.      . fluconazole (DIFLUCAN) 150 MG tablet Take 1 tablet (150 mg total) by mouth once.  1 tablet  2  . Melatonin 1 MG CAPS Take by mouth.      . Multiple Vitamin (MULTIVITAMIN) tablet Take 1 tablet by mouth daily.      . tamoxifen (NOLVADEX) 10 MG tablet Take 10 mg by mouth daily.         OBJECTIVE: [name] who appears healthy Filed Vitals:   03/09/11 1627  BP: 112/76  Pulse: 69  Temp: 98 F (36.7 C)     Body mass index is 27.31 kg/(m^2).    ECOG FS: 0  Sclerae unicteric Oropharynx clear No peripheral adenopathy Lungs no rales or rhonchi Heart regular rate and rhythm Abd benign MSK there is no swelling, redness, or tenderness in the right shoulder. There is no crepitus or pain with passive motion. On abductor beyond 90 of there is some discomfort; otherwise no focal spinal tenderness, no peripheral edema Neuro: nonfocal Breasts: Right breast no suspicious findings; left breast status post lumpectomy; no evidence of local recurrence  LAB RESULTS: Lab Results  Component Value Date   WBC 6.2 02/28/2011   NEUTROABS 3.7 02/28/2011   HGB 11.7 02/28/2011   HCT 34.7* 02/28/2011   MCV 85.4 02/28/2011   PLT 246 02/28/2011      Chemistry      Component Value Date/Time   NA 139 02/28/2011 1603   K 4.1 02/28/2011 1603   CL 105 02/28/2011 1603   CO2 26 02/28/2011 1603   BUN 16 02/28/2011 1603   CREATININE 0.92 02/28/2011 1603      Component Value Date/Time   CALCIUM 9.1 02/28/2011 1603   ALKPHOS 67 02/28/2011 1603   AST 16 02/28/2011 1603   ALT 11 02/28/2011 1603   BILITOT 0.1* 02/28/2011 1603       Lab Results  Component Value Date   LABCA2 9 02/28/2011    No results found for this basename: INR:1;PROTIME:1 in the last 168 hours  No results found for this basename: UACOL:1,UAPR:1,USPG:1,UPH:1,UTP:1,UGL:1,UKET:1,UBIL:1,UHGB:1,UNIT:1,UROB:1,ULEU:1,UEPI:1,UWBC:1,URBC:1,UBAC:1,CAST:1,CRYS:1,UCOM:1,BILUA:1 in the last 72  hours   STUDIES: Mm Digital Diagnostic Bilat  03/07/2011  *RADIOLOGY REPORT*  Clinical Data:  The patient underwent left lumpectomy, chemotherapy and radiation therapy for breast cancer in 2006/2007.  DIGITAL DIAGNOSTIC BILATERAL MAMMOGRAM WITH CAD  Comparison:  03/05/2010, 02/06/2009, 01/15/2008, 12/15/2006  Findings:  There are scattered fibroglandular densities.  Left lumpectomy changes are present.  There is no dominant mass, nonsurgical architectural distortion or calcification to suggest malignancy. Mammographic images were processed with CAD.  IMPRESSION: No mammographic evidence of malignancy.  Yearly screening mammography may now be instituted.  BI-RADS CATEGORY 2:  Benign finding(s).  Original Report Authenticated By: Daryl Eastern, M.D.    ASSESSMENT:A 51 year old BRCA 1-2 negative [place] woman status post left lumpectomy and sentinel lymph node biopsy July 2006 for a T1c N1,stage  IIA invasive ductal carcinoma, grade 3, estrogen and progesterone receptor positive, HER2/neu negative, treated with dose dense doxorubicin and cyclophosphamide x4 followed by paclitaxel x4 also in dose dense fashion followed by radiation.  She was on tamoxifen between March 2007 and March 2012 at which time she started letrozole, but with poor tolerance switched back to tamoxifen December 2012.   PLAN: We now have data at that there is additional benefit to continuing tamoxifen up to 10 years. The margin of benefit is similar to that of taking an aromatase inhibitor for 5 years after 5 years of tamoxifen. Of course comparison between different studies are always risky, but given her excellent tolerance of tamoxifen and is due date I am uncomfortable with her going back on tamoxifen and staying on the medication until she completes a total of 10 years which will be July of 2016.  Dr. Lily Peer is planning a yearly transvaginal ultrasound to follow her endometrial stripe, and this seems reasonable to me. She  has a repeat bone density due in April. She is going to return to see me in March of 2014.  As far as her right shoulder discomfort is concerned, she may have some bursitis or early rotator cuff pathology. I suggested she avoid exercises involving pushing weights above the shoulder and see if that relieves the discomfort. Otherwise if symptoms worsen she should seek orthopedic evaluation.   Margeart Allender C    03/09/2011

## 2011-03-09 NOTE — Telephone Encounter (Signed)
gave patient appointment for 02-2012  printed out calendar and gave to the patient 

## 2011-04-27 ENCOUNTER — Ambulatory Visit (INDEPENDENT_AMBULATORY_CARE_PROVIDER_SITE_OTHER): Payer: 59

## 2011-04-27 DIAGNOSIS — Z13820 Encounter for screening for osteoporosis: Secondary | ICD-10-CM

## 2011-04-27 DIAGNOSIS — Z853 Personal history of malignant neoplasm of breast: Secondary | ICD-10-CM

## 2011-06-22 ENCOUNTER — Other Ambulatory Visit: Payer: Self-pay | Admitting: Sports Medicine

## 2011-06-22 DIAGNOSIS — M25511 Pain in right shoulder: Secondary | ICD-10-CM

## 2011-07-01 ENCOUNTER — Other Ambulatory Visit: Payer: 59

## 2012-01-18 ENCOUNTER — Other Ambulatory Visit: Payer: Self-pay | Admitting: Oncology

## 2012-02-27 ENCOUNTER — Other Ambulatory Visit: Payer: Self-pay | Admitting: Gynecology

## 2012-02-27 DIAGNOSIS — Z853 Personal history of malignant neoplasm of breast: Secondary | ICD-10-CM

## 2012-02-27 DIAGNOSIS — Z9889 Other specified postprocedural states: Secondary | ICD-10-CM

## 2012-02-28 ENCOUNTER — Other Ambulatory Visit: Payer: Self-pay | Admitting: *Deleted

## 2012-02-28 DIAGNOSIS — Z9889 Other specified postprocedural states: Secondary | ICD-10-CM

## 2012-03-15 ENCOUNTER — Other Ambulatory Visit: Payer: Self-pay | Admitting: *Deleted

## 2012-03-15 ENCOUNTER — Other Ambulatory Visit (HOSPITAL_BASED_OUTPATIENT_CLINIC_OR_DEPARTMENT_OTHER): Payer: 59 | Admitting: Lab

## 2012-03-15 DIAGNOSIS — C773 Secondary and unspecified malignant neoplasm of axilla and upper limb lymph nodes: Secondary | ICD-10-CM

## 2012-03-15 DIAGNOSIS — Z853 Personal history of malignant neoplasm of breast: Secondary | ICD-10-CM

## 2012-03-15 DIAGNOSIS — C50419 Malignant neoplasm of upper-outer quadrant of unspecified female breast: Secondary | ICD-10-CM

## 2012-03-15 LAB — CBC WITH DIFFERENTIAL/PLATELET
BASO%: 0.4 % (ref 0.0–2.0)
EOS%: 1.3 % (ref 0.0–7.0)
LYMPH%: 29.9 % (ref 14.0–49.7)
MCH: 28.1 pg (ref 25.1–34.0)
MCHC: 33.4 g/dL (ref 31.5–36.0)
MCV: 84.3 fL (ref 79.5–101.0)
MONO%: 6.1 % (ref 0.0–14.0)
NEUT#: 4 10*3/uL (ref 1.5–6.5)
RBC: 4.36 10*6/uL (ref 3.70–5.45)
RDW: 13.7 % (ref 11.2–14.5)

## 2012-03-15 LAB — COMPREHENSIVE METABOLIC PANEL (CC13)
ALT: 15 U/L (ref 0–55)
AST: 16 U/L (ref 5–34)
Albumin: 3.9 g/dL (ref 3.5–5.0)
Alkaline Phosphatase: 62 U/L (ref 40–150)
Glucose: 105 mg/dl — ABNORMAL HIGH (ref 70–99)
Potassium: 4.1 mEq/L (ref 3.5–5.1)
Sodium: 138 mEq/L (ref 136–145)
Total Protein: 7.3 g/dL (ref 6.4–8.3)

## 2012-03-16 LAB — VITAMIN D 25 HYDROXY (VIT D DEFICIENCY, FRACTURES): Vit D, 25-Hydroxy: 31 ng/mL (ref 30–89)

## 2012-03-22 ENCOUNTER — Ambulatory Visit (HOSPITAL_BASED_OUTPATIENT_CLINIC_OR_DEPARTMENT_OTHER): Payer: 59 | Admitting: Oncology

## 2012-03-22 ENCOUNTER — Telehealth: Payer: Self-pay | Admitting: *Deleted

## 2012-03-22 VITALS — BP 120/76 | HR 83 | Temp 97.9°F | Resp 20 | Ht 63.5 in | Wt 156.5 lb

## 2012-03-22 DIAGNOSIS — Z853 Personal history of malignant neoplasm of breast: Secondary | ICD-10-CM

## 2012-03-22 DIAGNOSIS — C50419 Malignant neoplasm of upper-outer quadrant of unspecified female breast: Secondary | ICD-10-CM

## 2012-03-22 DIAGNOSIS — Z170 Estrogen receptor positive status [ER+]: Secondary | ICD-10-CM

## 2012-03-22 MED ORDER — TAMOXIFEN CITRATE 20 MG PO TABS: 20.0000 mg | ORAL_TABLET | Freq: Every day | ORAL | Status: DC

## 2012-03-22 NOTE — Telephone Encounter (Signed)
Made appts for pt. Per pof lab a week before visit, scheduled lab for 04/15/2013 @ 4pm, and OV @ 3:30pm.

## 2012-03-22 NOTE — Progress Notes (Signed)
ID: Cassandra Dickson   DOB: 1960-01-25  MR#: [id number]  WUJ#:[id number]  PCP: No primary provider on file. GYN: Cassandra Dickson SU: Cassandra Dickson) OTHER MD: Cassandra Dickson   HISTORY OF PRESENT ILLNESS: The patient herself palpated a mass in her left breast.  She was evaluated for this at [facility] with mammograms and ultrasound (June 21, 2004).  This showed a solid hypoechoic mass in the left breast, measuring approximately 11-mm by ultrasound.  The patient was referred to the [facility] for a biopsy under ultrasound guidance and this was performed July 01, 2004.  It showed ([id number] and [id number]) a poorly- differentiated infiltrating ductal carcinoma, strongly ER positive and PR positive, Hercept test 2+, negative by FISH.    With this information, the patient was referred to Dr. Maple Dickson and a breast MRI was obtained showing a solitary lesion in the left breast.  Accordingly, on July 22, 2004 the patient underwent a left lumpectomy and sentinel lymph node biopsy under Cassandra Dickson.  The final pathology ([id number]) confirmed the presence of a grade 3, infiltrating ductal carcinoma measuring 1.7-cm.  There was evidence of lymphovascular invasion.  The margins were negative.  The single sentinel lymph node showed a micrometastatic focus measuring approximately 1/3 of a millimeter. The cells showed evidence of mitotic activity and therefore were read as a micrometastatic deposit, not as isolated tumor cells.  However H&E was negative.   INTERVAL HISTORY: Cassandra Dickson returns for followup of her breast cancer. The interval history is unremarkable. Her son has been accepted to [facility], but is hoping to get into [place] [employer]. She herself has been engaged for the past 2 years and is probably going to get married sometime this fall.   REVIEW OF SYSTEMS: She continues to work long hours and it can be stressful. She can develop some constipation problems associated with distress. She treated with 5  or. She exercises almost every day, usually by walking, but she also goes to the gym about 3 times per week. She is tolerating the tamoxifen with no side effects that she is aware of. She was having some pain under the left breast, but that has resolved without intervention. A detailed review of systems today was noncontributory.  PAST MEDICAL HISTORY: Past Medical History  Diagnosis Date  . Diabetes mellitus     GESTATIONAL  . NSVD (normal spontaneous vaginal delivery)   . BRCA1 negative   . BRCA2 negative   . Cancer 06/2004    LEFT BREAST CANCER.[facility] RADIATION / CHEMO    PAST SURGICAL HISTORY: Past Surgical History  Procedure Laterality Date  . Colonoscopy  06/2007    BENIGN CECAL POLYP   . Breast surgery  2006    LEFT LUMPECTOMY    FAMILY HISTORY Family History  Problem Relation Age of Onset  . Cancer Father     LUNG  . Hypertension Maternal Aunt   . Diabetes Maternal Uncle   . Breast cancer Maternal Grandmother   [name] died from small-cell lung cancer at the age of 57.  He was a smoker.  The patient's mother is alive, in her 15's.  The patient has one brother and no sisters.  One of her mother's sisters and her mother's mother had a diagnosis of breast cancer, the  maternal aunt, approximately at age 60, her maternal grandmother in her old age.  There is no history of ovarian cancer in the family.   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 She is divorced. She lives with her son, Cassandra Dickson, currently at [place]  The patient is a Control and instrumentation engineer.     ADVANCED DIRECTIVES:  HEALTH MAINTENANCE: History  Substance Use Topics  . Smoking status: Never Smoker   . Smokeless tobacco: Never Used  . Alcohol Use: Yes     Comment: WINE      Colonoscopy:  PAP:  Bone density:  Lipid panel:  No Known Allergies  Current Outpatient Prescriptions   Medication Sig Dispense Refill  . calcium carbonate (OS-CAL) 600 MG TABS Take 600 mg by mouth 2 (two) times daily with a meal.      . cholecalciferol (VITAMIN D) 1000 UNITS tablet Take 1,000 Units by mouth daily.      . ferrous sulfate 325 (65 FE) MG tablet Take 325 mg by mouth daily with breakfast.      . Melatonin 1 MG CAPS Take by mouth.      . Multiple Vitamin (MULTIVITAMIN) tablet Take 1 tablet by mouth daily.      . tamoxifen (NOLVADEX) 20 MG tablet TAKE 1 TABLET EVERY DAY  30 tablet  2   No current facility-administered medications for this visit.    OBJECTIVE: [name] who appears well Filed Vitals:   03/22/12 1604  BP: 120/76  Pulse: 83  Temp: 97.9 F (36.6 C)  Resp: 20     Body mass index is 27.28 kg/(m^2).    ECOG FS: 0  Sclerae unicteric Oropharynx clear No peripheral adenopathy Lungs no rales or rhonchi Heart regular rate and rhythm Abd benign MSK no focal spinal tenderness, no peripheral edema Neuro: nonfocal, well oriented, appropriate affect Breasts: Right breast no suspicious findings; left breast status post lumpectomy; no evidence of local recurrence; left axilla is benign  LAB RESULTS: Lab Results  Component Value Date   WBC 6.5 03/15/2012   NEUTROABS 4.0 03/15/2012   HGB 12.3 03/15/2012   HCT 36.7 03/15/2012   MCV 84.3 03/15/2012   PLT 258 03/15/2012      Chemistry      Component Value Date/Time   NA 138 03/15/2012 1615   NA 139 02/28/2011 1603   K 4.1 03/15/2012 1615   K 4.1 02/28/2011 1603   CL 104 03/15/2012 1615   CL 105 02/28/2011 1603   CO2 25 03/15/2012 1615   CO2 26 02/28/2011 1603   BUN 16.1 03/15/2012 1615   BUN 16 02/28/2011 1603   CREATININE 0.9 03/15/2012 1615   CREATININE 0.92 02/28/2011 1603      Component Value Date/Time   CALCIUM 9.0 03/15/2012 1615   CALCIUM 9.1 02/28/2011 1603   ALKPHOS 62 03/15/2012 1615   ALKPHOS 67 02/28/2011 1603   AST 16 03/15/2012 1615   AST 16 02/28/2011 1603   ALT 15 03/15/2012 1615   ALT 11 02/28/2011 1603    BILITOT 0.27 03/15/2012 1615   BILITOT 0.1* 02/28/2011 1603       Lab Results  Component Value Date   LABCA2 9 03/15/2012    No results found for this basename: INR,  in the last 168 hours  No results found for this basename: UACOL, UAPR, USPG, UPH, UTP, UGL, UKET, UBIL, UHGB, UNIT, UROB, ULEU, UEPI, UWBC, URBC, UBAC, CAST, CRYS, UCOM, BILUA,  in the last 72 hours   STUDIES: Bone density 04/27/2011 was normal Mammogram is due next week.  ASSESSMENT:51 y.o.  BRCA 1-2 negative [place] woman status  post left lumpectomy and sentinel lymph node biopsy July 2006 for a T1c N1,stage IIA invasive ductal carcinoma, grade 3, estrogen and progesterone receptor positive, HER2/neu negative, treated with dose dense doxorubicin and cyclophosphamide x4 followed by paclitaxel x4 also in dose dense fashion followed by radiation.  She was on tamoxifen between March 2007 and March 2012 at which time she started letrozole, but with poor tolerance switched back to tamoxifen December 2012.   PLAN: Calise is doing terrific, and I refilled her tamoxifen prescription today. The plan is to continue on until March of 2017. I suggested her son that might enjoy "who owns the future", and that she and her husband to be might enjoy a cycling trip to the [place]. Basically I think perhaps a little less stress at work and more location time would be a good idea at this point. In any case she knows to call for any problems that may develop before the next visit, which will be in one year.  MAGRINAT,GUSTAV C    03/22/2012

## 2012-03-27 ENCOUNTER — Encounter: Payer: 59 | Admitting: Gynecology

## 2012-03-30 ENCOUNTER — Ambulatory Visit
Admission: RE | Admit: 2012-03-30 | Discharge: 2012-03-30 | Disposition: A | Discharge status: Home / Self Care | Payer: 59 | Source: Ambulatory Visit | Attending: Gynecology | Admitting: Gynecology

## 2012-03-30 DIAGNOSIS — Z9889 Other specified postprocedural states: Secondary | ICD-10-CM

## 2012-04-23 ENCOUNTER — Telehealth: Payer: Self-pay | Admitting: *Deleted

## 2012-04-23 NOTE — Telephone Encounter (Signed)
Message received from pt stating concern due to onset of menses over the weekend post hiatus x 8 years.  She is on tamoxifen and is already scheduled to see her GYN ( Dr Lily Peer ) next week.  Return call number given as [phone]. Call returned and obtianed identified VM.  This RN left message requesting a return call.

## 2012-05-10 ENCOUNTER — Encounter: Payer: Self-pay | Admitting: Gynecology

## 2012-05-10 ENCOUNTER — Other Ambulatory Visit (HOSPITAL_COMMUNITY)
Admission: RE | Admit: 2012-05-10 | Discharge: 2012-05-10 | Disposition: A | Discharge status: Home / Self Care | Payer: 59 | Source: Ambulatory Visit | Attending: Gynecology | Admitting: Gynecology

## 2012-05-10 ENCOUNTER — Ambulatory Visit (INDEPENDENT_AMBULATORY_CARE_PROVIDER_SITE_OTHER): Payer: 59 | Admitting: Gynecology

## 2012-05-10 VITALS — BP 120/78 | Ht 63.75 in | Wt 156.0 lb

## 2012-05-10 DIAGNOSIS — N950 Postmenopausal bleeding: Secondary | ICD-10-CM | POA: Insufficient documentation

## 2012-05-10 DIAGNOSIS — Z1151 Encounter for screening for human papillomavirus (HPV): Secondary | ICD-10-CM | POA: Insufficient documentation

## 2012-05-10 DIAGNOSIS — Z01419 Encounter for gynecological examination (general) (routine) without abnormal findings: Secondary | ICD-10-CM

## 2012-05-10 DIAGNOSIS — Z853 Personal history of malignant neoplasm of breast: Secondary | ICD-10-CM

## 2012-05-10 DIAGNOSIS — Z23 Encounter for immunization: Secondary | ICD-10-CM

## 2012-05-10 NOTE — Progress Notes (Signed)
Cassandra Dickson 1960-12-15 [id number]   History:    52 y.o.  for annual gyn exam who stated that she recently had a heavy menstrual cycle and she has not had one in 8 years. Patient with history of left breast cancer poorly differentiated infiltrating ductal carcinoma status post lumpectomy and chemotherapy and radiation therapy in 2006. Patient currently on extended tamoxifen as per oncologist.  Review of her record indicated that in December 2011 she had a left ovarian cyst measuring 3.2 x 2.0 x 2.0 cm.  followup ultrasound 2013 complete resolution otherwise normal ultrasound with a thin endometrial stripe of 1.7 mm.  Review of her record also indicated in 2009 she had a cecal polyp. Her last bone density study was in 2013 and she had a mammogram this year which was normal. Patient's currently on year 7 of tamoxifen. Her BRCA1 and BRCA2 gene mutation was negative recent CBC and comprehensive metabolic panel done at the oncologist office. Patient would know prior history of abnormal Pap smear.  Past medical history,surgical history, family history and social history were all reviewed and documented in the EPIC chart.  Gynecologic History Patient's last menstrual period was 03/21/2012. Contraception: condoms Last Pap: 2013. Results were: normal Last mammogram: 2014. Results were: normal  Obstetric History OB History   Grav Para Term Preterm Abortions TAB SAB Ect Mult Living   1 1  1      1      # Outc Date GA Lbr Len/2nd Wgt Sex Del Anes PTL Lv   1 PRE     M SVD  Yes Yes       ROS: A ROS was performed and pertinent positives and negatives are included in the history.  GENERAL: No fevers or chills. HEENT: No change in vision, no earache, sore throat or sinus congestion. NECK: No pain or stiffness. CARDIOVASCULAR: No chest pain or pressure. No palpitations. PULMONARY: No shortness of breath, cough or wheeze. GASTROINTESTINAL: No abdominal pain, nausea, vomiting or diarrhea, melena or bright  red blood per rectum. GENITOURINARY: No urinary frequency, urgency, hesitancy or dysuria. MUSCULOSKELETAL: No joint or muscle pain, no back pain, no recent trauma. DERMATOLOGIC: No rash, no itching, no lesions. ENDOCRINE: No polyuria, polydipsia, no heat or cold intolerance. No recent change in weight. HEMATOLOGICAL: No anemia or easy bruising unusual menstrual cycle for first time in 8 yearsNEUROLOGIC: No headache, seizures, numbness, tingling or weakness. PSYCHIATRIC: No depression, no loss of interest in normal activity or change in sleep pattern.     Exam: chaperone present  BP 120/78  Ht 5' 3.75" (1.619 m)  Wt 156 lb (70.761 kg)  BMI 27 kg/m2  LMP 03/21/2012  Body mass index is 27 kg/(m^2).  General appearance : Well developed well nourished female. No acute distress HEENT: Neck supple, trachea midline, no carotid bruits, no thyroidmegaly Lungs: Clear to auscultation, no rhonchi or wheezes, or rib retractions  Heart: Regular rate and rhythm, no murmurs or gallops Breast:Examined in sitting and supine position were symmetrical in appearance, no palpable masses or tenderness,  no skin retraction, no nipple inversion, no nipple discharge, no skin discoloration, no axillary or supraclavicular lymphadenopathy Abdomen: no palpable masses or tenderness, no rebound or guarding Extremities: no edema or skin discoloration or tenderness  Pelvic:  Bartholin, Urethra, Skene Glands: Within normal limits             Vagina: No gross lesions or discharge  Cervix: No gross lesions or discharge  Uterus  anteverted, normal size, shape  and consistency, non-tender and mobile  Adnexa  Without masses or tenderness  Anus and perineum  normal   Rectovaginal  normal sphincter tone without palpated masses or tenderness             Hemoccult cards to be provided     Assessment/Plan:  52 y.o. female for annual exam status post left lumpectomy and sentinel lymph node biopsy July 2006 for a T1c N1,stage IIA  invasive ductal carcinoma, grade 3, estrogen and progesterone receptor positive, HER2/neu negative, treated with dose dense doxorubicin and cyclophosphamide x4 followed by paclitaxel x4. She was on tamoxifen between March 2007 and March 2012 at which time she started letrozole, but with poor tolerance switched back to tamoxifen December 2012 which was extended to recovery 10 year time frame. Because of this first unusual bleeding since she is on tamoxifen she was counseled and underwent an endometrial biopsy with a sterile Pipelle here in the office. Very little tissue was obtained and was cemented histological evaluation. Patient was asked to return back to the office in the next week to do a sonohysterogram to rule out any endometrial polyps or submucous myoma contribute to this bleed. Since all her lab work was done recently and the only thing that was not drawn with a total cholesterol which was drawn today. A Pap smear was also done today. We discussed importance of calcium vitamin D and regular exercise for osteoporosis prevention. She had a normal bone density study in 2013 and one on the 1 to next year. She was reminded to followup with her gastroenterologist and she is due for her colonoscopy.     Ok Edwards MD, 5:05 PM 05/10/2012

## 2012-05-10 NOTE — Patient Instructions (Addendum)
Tetanus, Diphtheria, Pertussis (Tdap) Vaccine What You Need to Know WHY GET VACCINATED? Tetanus, diphtheria and pertussis can be very serious diseases, even for adolescents and adults. Tdap vaccine can protect [place] from these diseases. TETANUS (Lockjaw) causes painful muscle tightening and stiffness, usually all over the body. It can lead to tightening of muscles in the head and neck so you can't open your mouth, swallow, or sometimes even breathe. Tetanus kills about 1 out of 5 people who are infected. DIPHTHERIA can cause a thick coating to form in the back of the throat. It can lead to breathing problems, paralysis, heart failure, and death. PERTUSSIS (Whooping Cough) causes severe coughing spells, which can cause difficulty breathing, vomiting and disturbed sleep. It can also lead to weight loss, incontinence, and rib fractures. Up to 2 in 100 adolescents and 5 in 100 adults with pertussis are hospitalized or have complications, which could include pneumonia and death. These diseases are caused by bacteria. Diphtheria and pertussis are spread from person to person through coughing or sneezing. Tetanus enters the body through cuts, scratches, or wounds. Before vaccines, the [place] saw as many as 200,000 cases a year of diphtheria and pertussis, and hundreds of cases of tetanus. Since vaccination began, tetanus and diphtheria have dropped by about 99% and pertussis by about 80%. TDAP VACCINE Tdap vaccine can protect adolescents and adults from tetanus, diphtheria, and pertussis. One dose of Tdap is routinely given at age 66 or 22. People who did not get Tdap at that age should get it as soon as possible. Tdap is especially important for health care professionals and anyone having close contact with a baby younger than 12 months. Pregnant women should get a dose of Tdap during every pregnancy, to protect the newborn from pertussis. Infants are most at risk for severe, life-threatening  complications from pertussis. A similar vaccine, called Td, protects from tetanus and diphtheria, but not pertussis. A Td booster should be given every 10 years. Tdap may be given as one of these boosters if you have not already gotten a dose. Tdap may also be given after a severe cut or burn to prevent tetanus infection. Your doctor can give you more information. Tdap may safely be given at the same time as other vaccines. SOME PEOPLE SHOULD NOT GET THIS VACCINE If you ever had a life-threatening allergic reaction after a dose of any tetanus, diphtheria, or pertussis containing vaccine, OR if you have a severe allergy to any part of this vaccine, you should not get Tdap. Tell your doctor if you have any severe allergies. If you had a coma, or long or multiple seizures within 7 days after a childhood dose of DTP or DTaP, you should not get Tdap, unless a cause other than the vaccine was found. You can still get Td. Talk to your doctor if you: have epilepsy or another nervous system problem, had severe pain or swelling after any vaccine containing diphtheria, tetanus or pertussis, ever had Guillain-Barr Syndrome (GBS), aren't feeling well on the day the shot is scheduled. RISKS OF A VACCINE REACTION With any medicine, including vaccines, there is a chance of side effects. These are usually mild and go away on their own, but serious reactions are also possible. Brief fainting spells can follow a vaccination, leading to injuries from falling. Sitting or lying down for about 15 minutes can help prevent these. Tell your doctor if you feel dizzy or light-headed, or have vision changes or ringing in the ears. Mild problems  following Tdap (Did not interfere with activities) Pain where the shot was given (about 3 in 4 adolescents or 2 in 3 adults) Redness or swelling where the shot was given (about 1 person in 5) Mild fever of at least 100.42F (up to about 1 in 25 adolescents or 1 in 100 adults) Headache  (about 3 or 4 people in 10) Tiredness (about 1 person in 3 or 4) Nausea, vomiting, diarrhea, stomach ache (up to 1 in 4 adolescents or 1 in 10 adults) Chills, body aches, sore joints, rash, swollen glands (uncommon) Moderate problems following Tdap (Interfered with activities, but did not require medical attention) Pain where the shot was given (about 1 in 5 adolescents or 1 in 100 adults) Redness or swelling where the shot was given (up to about 1 in 16 adolescents or 1 in 25 adults) Fever over 102F (about 1 in 100 adolescents or 1 in 250 adults) Headache (about 3 in 20 adolescents or 1 in 10 adults) Nausea, vomiting, diarrhea, stomach ache (up to 1 or 3 people in 100) Swelling of the entire arm where the shot was given (up to about 3 in 100). Severe problems following Tdap (Unable to perform usual activities, required medical attention) Swelling, severe pain, bleeding and redness in the arm where the shot was given (rare). A severe allergic reaction could occur after any vaccine (estimated less than 1 in a million doses). WHAT IF THERE IS A SERIOUS REACTION? What should I look for? Look for anything that concerns you, such as signs of a severe allergic reaction, very high fever, or behavior changes. Signs of a severe allergic reaction can include hives, swelling of the face and throat, difficulty breathing, a fast heartbeat, dizziness, and weakness. These would start a few minutes to a few hours after the vaccination. What should I do? If you think it is a severe allergic reaction or other emergency that can't wait, call 9-1-1 or get the person to the nearest hospital. Otherwise, call your doctor. Afterward, the reaction should be reported to the "Vaccine Adverse Event Reporting System" (VAERS). Your doctor might file this report, or you can do it yourself through the VAERS web site at www.vaers.LAgents.no, or by calling 1-(604)598-3465. VAERS is only for reporting reactions. They do not give  medical advice.  THE NATIONAL VACCINE INJURY COMPENSATION PROGRAM The National Vaccine Injury Compensation Program (VICP) is a federal program that was created to compensate people who may have been injured by certain vaccines. Persons who believe they may have been injured by a vaccine can learn about the program and about filing a claim by calling 1-701-027-1844 or visiting the VICP website at SpiritualWord.at. HOW CAN I LEARN MORE? Ask your doctor. Call your local or state health department. Contact the Centers for Disease Control and Prevention (CDC): Call (239) 789-5607 or visit CDC's website at PicCapture.uy CDC Tdap Vaccine VIS (05/19/11) Document Released: 06/28/2011 Document Reviewed: 06/28/2011 [facility] Patient Information 2013 [place], [place]. Transvaginal Ultrasound Transvaginal ultrasound is a pelvic ultrasound, using a metal probe that is placed in the vagina, to look at a women's female organs. Transvaginal ultrasound is a method of seeing inside the pelvis of a woman. The ultrasound machine sends out sound waves from the transducer (probe). These sound waves bounce off body structures (like an echo) to create a picture. The picture shows up on a monitor. It is called transvaginal because the probe is inserted into the vagina. There should be very little discomfort from the vaginal probe. This test can  also be used during pregnancy. Endovaginal ultrasound is another name for a transvaginal ultrasound. In a transabdominal ultrasound, the probe is placed on the outside of the belly. This method gives pictures that are lower quality than pictures from the transvaginal technique. Transvaginal ultrasound is used to look for problems of the female genital tract. Some such problems include:  Infertility problems.  Congenital (birth defect) malformations of the uterus and ovaries.  Tumors in the uterus.  Abnormal bleeding.  Ovarian tumors and cysts.  Abscess  (inflamed tissue around pus) in the pelvis.  Unexplained abdominal or pelvic pain.  Pelvic infection. DURING PREGNANCY, TRANSVAGINAL ULTRASOUND MAY BE USED TO LOOK AT:  Normal pregnancy.  Ectopic pregnancy (pregnancy outside the uterus).  Fetal heartbeat.  Abnormalities in the pelvis, that are not seen well with transabdominal ultrasound.  Suspected twins or multiples.  Impending miscarriage.  Problems with the cervix (incompetent cervix, not able to stay closed and hold the baby).  When doing an amniocentesis (removing fluid from the pregnancy sac, for testing).  Looking for abnormalities of the baby.  Checking the growth, development, and age of the fetus.  Measuring the amount of fluid in the amniotic sac.  When doing an external version of the baby (moving baby into correct position).  Evaluating the baby for problems in high risk pregnancies (biophysical profile).  Suspected fetal demise (death). Sometimes a special ultrasound method called Saline Infusion Sonography (SIS) is used for a more accurate look at the uterus. Sterile saline (salt water) is injected into the uterus of non-pregnant patients to see the inside of the uterus better. SIS is not used on pregnant women. The vaginal probe can also assist in obtaining biopsies of abnormal areas, in draining fluid from cysts on the ovary, and in finding IUDs (intrauterine device, birth control) that cannot be located. PREPARATION FOR TEST A transvaginal ultrasound is done with the bladder empty. The transabdominal ultrasound is done with your bladder full. You may be asked to drink several glasses of water before that exam. Sometimes, a transabdominal ultrasound is done just after a transvaginal ultrasound, to look at organs in your abdomen. PROCEDURE  You will lie down on a table, with your knees bent and your feet in foot holders. The probe is covered with a condom. A sterile lubricant is put into the vagina and on the  probe. The lubricant helps transmit the sound waves and avoid irritating the vagina. Your caregiver will move the probe inside the vaginal cavity to scan the pelvic structures. A normal test will show a normal pelvis and normal contents. An abnormal test will show abnormalities of the pelvis, placenta, or baby. ABNORMAL RESULTS MAY BE DUE TO:  Growths or tumors in the:  Uterus.  Ovaries.  Vagina.  Other pelvic structures.  Non-cancerous growths of the uterus and ovaries.  Twisting of the ovary, cutting off blood supply to the ovary (ovarian torsion).  Areas of infection, including:  Pelvic inflammatory disease.  Abscess in the pelvis.  Locating an IUD. PROBLEMS FOUND IN PREGNANT WOMEN MAY INCLUDE:  Ectopic pregnancy (pregnancy outside the uterus).  Multiple pregnancies.  Early dilation (opening) of the cervix. This may indicate an incompetent cervix and early delivery.  Impending miscarriage.  Fetal death.  Problems with the placenta, including:  Placenta has grown over the opening of the womb (placenta previa).  Placenta has separated early in the womb (placental abruption).  Placenta grows into the muscle of the uterus (placenta accreta).  Tumors of pregnancy, including  gestational trophoblastic disease. This is an abnormal pregnancy, with no fetus. The uterus is filled with many grape-like cysts that could sometimes be cancerous.  Incorrect position of the fetus (breech, vertex).  Intrauterine fetal growth retardation (IUGR) (poor growth in the womb).  Fetal abnormalities or infection. RISKS AND COMPLICATIONS There are no known risks to the ultrasound procedure. There is no X-ray used when doing an ultrasound. Document Released: 12/09/2003 Document Revised: 03/21/2011 Document Reviewed: 11/26/2008 [facility] Patient Information 2013 [place], [place].

## 2012-05-17 ENCOUNTER — Other Ambulatory Visit: Payer: Self-pay | Admitting: Gynecology

## 2012-05-17 DIAGNOSIS — N950 Postmenopausal bleeding: Secondary | ICD-10-CM

## 2012-05-17 DIAGNOSIS — Z79810 Long term (current) use of selective estrogen receptor modulators (SERMs): Secondary | ICD-10-CM

## 2012-06-18 ENCOUNTER — Ambulatory Visit (INDEPENDENT_AMBULATORY_CARE_PROVIDER_SITE_OTHER): Payer: 59 | Admitting: Gynecology

## 2012-06-18 ENCOUNTER — Ambulatory Visit (INDEPENDENT_AMBULATORY_CARE_PROVIDER_SITE_OTHER): Payer: 59

## 2012-06-18 DIAGNOSIS — N83319 Acquired atrophy of ovary, unspecified side: Secondary | ICD-10-CM

## 2012-06-18 DIAGNOSIS — Z853 Personal history of malignant neoplasm of breast: Secondary | ICD-10-CM

## 2012-06-18 DIAGNOSIS — Z79810 Long term (current) use of selective estrogen receptor modulators (SERMs): Secondary | ICD-10-CM

## 2012-06-18 DIAGNOSIS — N950 Postmenopausal bleeding: Secondary | ICD-10-CM

## 2012-06-18 DIAGNOSIS — Z9221 Personal history of antineoplastic chemotherapy: Secondary | ICD-10-CM

## 2012-06-18 DIAGNOSIS — Z923 Personal history of irradiation: Secondary | ICD-10-CM

## 2012-06-18 DIAGNOSIS — N83339 Acquired atrophy of ovary and fallopian tube, unspecified side: Secondary | ICD-10-CM

## 2012-06-18 DIAGNOSIS — N8000 Endometriosis of the uterus, unspecified: Secondary | ICD-10-CM

## 2012-06-18 NOTE — Progress Notes (Signed)
52 year old was seen me first in the office for annual exam had reported an episode of heavy menstrual period and nontender cycling 8 years. Patient with history of left breast cancer poorly differentiated infiltrating ductal carcinoma status post lumpectomy and chemotherapy and radiation therapy in 2006. Patient currently on extended tamoxifen as per oncologist.  Review of her record indicated that in December 2011 she had a left ovarian cyst measuring 3.2 x 2.0 x 2.0 cm.  followup ultrasound 2013 complete resolution otherwise normal ultrasound with a thin endometrial stripe of 1.7 mm.  The patient reports no further bleeding since April of 2014. Her endometrial biopsy done on May 1 in the office demonstrated the following:   Diagnosis Endometrium, biopsy, uterus - BENIGN POLYPOID PROLIFERATIVE PATTERN ENDOMETRIUM. - NO HYPERPLASIA, ATYPIA OR MALIGNANCY IDENTIFIED. - SEE COMMENT. Microscopic Comment There are fragments present within the specimen that likely represent benign endometrial-type polyp formation. Please correlate with the clinical impression.  Pap smear done at the same time was normal as well.  Ultrasound today: uterus measures 7.9 x 4.7 x 2.7 cm with endometrial stripe of 3.7 mm. Uterus had a heterogeneous echo pattern with cortical cystic areas noted in the myometrium. Both right and left ovary were normal. There was no apparent masses on either right or left adnexa. Sonohysterogram following injection of normal saline demonstrated no intracavitary defects . Assessment/plan:51 y.o. female for annual exam status post left lumpectomy and sentinel lymph node biopsy July 2006 for a T1c N1,stage IIA invasive ductal carcinoma, grade 3, estrogen and progesterone receptor positive, HER2/neu negative, treated with dose dense doxorubicin and cyclophosphamide x4 followed by paclitaxel x4. She was on tamoxifen between March 2007 and March 2012 at which time she started letrozole, but with  poor tolerance switched back to tamoxifen December 2012 which was extended to recovery 10 year time frame. Because of this first unusual bleeding since she is on tamoxifen was the reason for the endometrial biopsy which was benign as well as the sonohysterogram. She'll maintain menstrual calendar. Otherwise we will see her back next year or when necessary. She was reminded to make an appointment for colonoscopy since it was 5 years ago that she had a cecal polyp removed.

## 2012-06-28 ENCOUNTER — Encounter: Payer: Self-pay | Admitting: Gynecology

## 2012-08-30 ENCOUNTER — Other Ambulatory Visit: Payer: Self-pay | Admitting: Oncology

## 2012-08-30 DIAGNOSIS — Z853 Personal history of malignant neoplasm of breast: Secondary | ICD-10-CM

## 2012-10-31 ENCOUNTER — Other Ambulatory Visit: Payer: Self-pay | Admitting: Oncology

## 2012-11-15 ENCOUNTER — Other Ambulatory Visit: Payer: Self-pay

## 2013-02-03 ENCOUNTER — Other Ambulatory Visit: Payer: Self-pay | Admitting: Oncology

## 2013-03-18 ENCOUNTER — Other Ambulatory Visit: Payer: Self-pay | Admitting: Gynecology

## 2013-03-18 DIAGNOSIS — Z853 Personal history of malignant neoplasm of breast: Secondary | ICD-10-CM

## 2013-03-18 DIAGNOSIS — Z9889 Other specified postprocedural states: Secondary | ICD-10-CM

## 2013-04-08 ENCOUNTER — Other Ambulatory Visit: Payer: Self-pay | Admitting: *Deleted

## 2013-04-08 DIAGNOSIS — Z853 Personal history of malignant neoplasm of breast: Secondary | ICD-10-CM

## 2013-04-08 MED ORDER — TAMOXIFEN CITRATE 20 MG PO TABS: 20.0000 mg | ORAL_TABLET | Freq: Every day | ORAL | Status: DC

## 2013-04-15 ENCOUNTER — Ambulatory Visit (HOSPITAL_BASED_OUTPATIENT_CLINIC_OR_DEPARTMENT_OTHER): Payer: 59 | Admitting: Lab

## 2013-04-15 DIAGNOSIS — C50419 Malignant neoplasm of upper-outer quadrant of unspecified female breast: Secondary | ICD-10-CM

## 2013-04-15 DIAGNOSIS — Z853 Personal history of malignant neoplasm of breast: Secondary | ICD-10-CM

## 2013-04-15 LAB — CBC WITH DIFFERENTIAL/PLATELET
BASO%: 0.5 % (ref 0.0–2.0)
Basophils Absolute: 0 10*3/uL (ref 0.0–0.1)
EOS%: 1 % (ref 0.0–7.0)
Eosinophils Absolute: 0.1 10*3/uL (ref 0.0–0.5)
HEMATOCRIT: 35.5 % (ref 34.8–46.6)
HGB: 11.7 g/dL (ref 11.6–15.9)
LYMPH#: 1.9 10*3/uL (ref 0.9–3.3)
LYMPH%: 27.9 % (ref 14.0–49.7)
MCH: 28.4 pg (ref 25.1–34.0)
MCHC: 32.9 g/dL (ref 31.5–36.0)
MCV: 86.5 fL (ref 79.5–101.0)
MONO#: 0.4 10*3/uL (ref 0.1–0.9)
MONO%: 5.6 % (ref 0.0–14.0)
NEUT#: 4.5 10*3/uL (ref 1.5–6.5)
NEUT%: 65 % (ref 38.4–76.8)
Platelets: 251 10*3/uL (ref 145–400)
RBC: 4.1 10*6/uL (ref 3.70–5.45)
RDW: 13.2 % (ref 11.2–14.5)
WBC: 7 10*3/uL (ref 3.9–10.3)

## 2013-04-15 LAB — COMPREHENSIVE METABOLIC PANEL (CC13)
ALT: 9 U/L (ref 0–55)
AST: 16 U/L (ref 5–34)
Albumin: 4 g/dL (ref 3.5–5.0)
Alkaline Phosphatase: 51 U/L (ref 40–150)
Anion Gap: 11 mEq/L (ref 3–11)
BILIRUBIN TOTAL: 0.27 mg/dL (ref 0.20–1.20)
BUN: 18.4 mg/dL (ref 7.0–26.0)
CHLORIDE: 106 meq/L (ref 98–109)
CO2: 24 mEq/L (ref 22–29)
CREATININE: 1.1 mg/dL (ref 0.6–1.1)
Calcium: 9.3 mg/dL (ref 8.4–10.4)
Glucose: 88 mg/dl (ref 70–140)
Potassium: 4.1 mEq/L (ref 3.5–5.1)
Sodium: 141 mEq/L (ref 136–145)
Total Protein: 7.1 g/dL (ref 6.4–8.3)

## 2013-04-18 ENCOUNTER — Ambulatory Visit
Admission: RE | Admit: 2013-04-18 | Discharge: 2013-04-18 | Disposition: A | Discharge status: Home / Self Care | Payer: Self-pay | Source: Ambulatory Visit | Attending: Gynecology | Admitting: Gynecology

## 2013-04-18 DIAGNOSIS — Z9889 Other specified postprocedural states: Secondary | ICD-10-CM

## 2013-04-18 DIAGNOSIS — Z853 Personal history of malignant neoplasm of breast: Secondary | ICD-10-CM

## 2013-04-24 ENCOUNTER — Telehealth: Payer: Self-pay | Admitting: Oncology

## 2013-04-24 ENCOUNTER — Ambulatory Visit (HOSPITAL_BASED_OUTPATIENT_CLINIC_OR_DEPARTMENT_OTHER): Payer: 59 | Admitting: Oncology

## 2013-04-24 VITALS — BP 132/75 | HR 71 | Temp 98.4°F | Resp 18 | Ht 63.75 in | Wt 155.8 lb

## 2013-04-24 DIAGNOSIS — N950 Postmenopausal bleeding: Secondary | ICD-10-CM

## 2013-04-24 DIAGNOSIS — F411 Generalized anxiety disorder: Secondary | ICD-10-CM

## 2013-04-24 DIAGNOSIS — Z853 Personal history of malignant neoplasm of breast: Secondary | ICD-10-CM

## 2013-04-24 DIAGNOSIS — C50419 Malignant neoplasm of upper-outer quadrant of unspecified female breast: Secondary | ICD-10-CM

## 2013-04-24 DIAGNOSIS — Z170 Estrogen receptor positive status [ER+]: Secondary | ICD-10-CM

## 2013-04-24 DIAGNOSIS — G4700 Insomnia, unspecified: Secondary | ICD-10-CM

## 2013-04-24 MED ORDER — GABAPENTIN 300 MG PO CAPS: 300.0000 mg | ORAL_CAPSULE | Freq: Every day | ORAL | Status: DC

## 2013-04-24 MED ORDER — TAMOXIFEN CITRATE 20 MG PO TABS: 20.0000 mg | ORAL_TABLET | Freq: Every day | ORAL | Status: DC

## 2013-04-24 NOTE — Progress Notes (Signed)
ID: Cassandra Dickson   DOB: May 31, 1960  MR#: [id number]  UUE#:[id number]  PCP: No primary provider on file. GYN: Uvaldo Rising SU: Marylene Buerger) OTHER MD: Tyler Pita, Jamie Kato   HISTORY OF PRESENT ILLNESS: The patient herself palpated a mass in her left breast.  She was evaluated for this at [facility] with mammograms and ultrasound (June 21, 2004).  This showed a solid hypoechoic mass in the left breast, measuring approximately 11-mm by ultrasound.  The patient was referred to the [place] for a biopsy under ultrasound guidance and this was performed July 01, 2004.  It showed ([id number] and [id number]) a poorly- differentiated infiltrating ductal carcinoma, strongly ER positive and PR positive, Hercept test 2+, negative by FISH.    With this information, the patient was referred to Dr. Annamaria Boots and a breast MRI was obtained showing a solitary lesion in the left breast.  Accordingly, on July 22, 2004 the patient underwent a left lumpectomy and sentinel lymph node biopsy under Marylene Buerger.  The final pathology ([id number]) confirmed the presence of a grade 3, infiltrating ductal carcinoma measuring 1.7-cm.  There was evidence of lymphovascular invasion.  The margins were negative.  The single sentinel lymph node showed a micrometastatic focus measuring approximately 1/3 of a millimeter. The cells showed evidence of mitotic activity and therefore were read as a micrometastatic deposit, not as isolated tumor cells.  However H&E was negative.   INTERVAL HISTORY: Cassandra Dickson returns for followup of her breast cancer. The interval history is unremarkable. The company she worked for moved to [place] and she lost her job January of this year. She has been going to the gym a lot and certainly has less stress than before, but she does feel more anxious.  REVIEW OF SYSTEMS: Her main problem is insomnia. This is directly related to losing her job. She is otherwise very healthy, with no unusual headaches,  visual changes, cough, phlegm production, pleurisy, shortness of breath, or change in bowel or bladder habits. There has been some weight gain, which concerns her, but no unexplained fatigue, fevers, rash, or drenching sweats. She is tolerating the tamoxifen with no hot flashes. She has minimal issues with vaginal dryness. A detailed review of systems today was otherwise noncontributory  PAST MEDICAL HISTORY: Past Medical History  Diagnosis Date  . Diabetes mellitus     GESTATIONAL  . NSVD (normal spontaneous vaginal delivery)   . BRCA1 negative   . BRCA2 negative   . Cancer 06/2004    LEFT BREAST CANCER.[facility] RADIATION / CHEMO    PAST SURGICAL HISTORY: Past Surgical History  Procedure Laterality Date  . Colonoscopy  06/2007    BENIGN CECAL POLYP   . Breast surgery  2006    LEFT LUMPECTOMY  . Abdominal hysterectomy      FAMILY HISTORY Family History  Problem Relation Age of Onset  . Cancer Father     LUNG  . Hypertension Maternal Aunt   . Diabetes Maternal Uncle   . Breast cancer Maternal Grandmother   [name] died from small-cell lung cancer at the age of 64.  He was a smoker.  The patient's mother is alive, in her 50's.  The patient has one brother and no sisters.  One of her mother's sisters and her mother's mother had a diagnosis of breast cancer, the  maternal aunt, approximately at age 39, her maternal grandmother in her old age.  There is no history of ovarian cancer in the family.  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 Her son, [place], is currently at [employer]  The patient is a Psychologist, forensic.     ADVANCED DIRECTIVES:  HEALTH MAINTENANCE: History  Substance Use Topics  . Smoking status: Never Smoker   . Smokeless tobacco: Never Used  . Alcohol Use: Yes      Comment: WINE      Colonoscopy:  PAP:  Bone density:  Lipid panel:  No Known Allergies  Current Outpatient Prescriptions  Medication Sig Dispense Refill  . calcium carbonate (OS-CAL) 600 MG TABS Take 600 mg by mouth 2 (two) times daily with a meal.      . cholecalciferol (VITAMIN D) 1000 UNITS tablet Take 1,000 Units by mouth daily.      . ferrous sulfate 325 (65 FE) MG tablet Take 325 mg by mouth daily with breakfast.      . Melatonin 1 MG CAPS Take by mouth.      . Multiple Vitamin (MULTIVITAMIN) tablet Take 1 tablet by mouth daily.      . tamoxifen (NOLVADEX) 20 MG tablet TAKE 1 TABLET EVERY DAY  30 tablet  2  . tamoxifen (NOLVADEX) 20 MG tablet Take 1 tablet (20 mg total) by mouth daily.  90 tablet  0   No current facility-administered medications for this visit.    OBJECTIVE: [name] i will looks well Filed Vitals:   04/24/13 1532  BP: 132/75  Pulse: 71  Temp: 98.4 F (36.9 C)  Resp: 18     Body mass index is 26.96 kg/(m^2).    ECOG FS: 0  Sclerae unicteric, pupils equal round and reactive Oropharynx clear, teeth in good repair No cervical or supraclavicular adenopathy Lungs no rales or rhonchi Heart regular rate and rhythm Abd soft, nontender, positive bowel sounds MSK no focal spinal tenderness, no upper extremity lymphedema Neuro: nonfocal, well oriented, appropriate affect Breasts: Right breast no suspicious findings; left breast status post lumpectomy and radiation; no evidence of local recurrence; the left axilla is benign  LAB RESULTS: Lab Results  Component Value Date   WBC 7.0 04/15/2013   NEUTROABS 4.5 04/15/2013   HGB 11.7 04/15/2013   HCT 35.5 04/15/2013   MCV 86.5 04/15/2013   PLT 251 04/15/2013      Chemistry      Component Value Date/Time   NA 141 04/15/2013 1610   NA 139 02/28/2011 1603   K 4.1 04/15/2013 1610   K 4.1 02/28/2011 1603   CL 104 03/15/2012 1615   CL 105 02/28/2011 1603   CO2 24 04/15/2013 1610   CO2 26 02/28/2011 1603   BUN  18.4 04/15/2013 1610   BUN 16 02/28/2011 1603   CREATININE 1.1 04/15/2013 1610   CREATININE 0.92 02/28/2011 1603      Component Value Date/Time   CALCIUM 9.3 04/15/2013 1610   CALCIUM 9.1 02/28/2011 1603   ALKPHOS 51 04/15/2013 1610   ALKPHOS 67 02/28/2011 1603   AST 16 04/15/2013 1610   AST 16 02/28/2011 1603   ALT 9 04/15/2013 1610   ALT 11 02/28/2011 1603   BILITOT 0.27 04/15/2013 1610   BILITOT 0.1* 02/28/2011 1603       Lab Results  Component Value Date   LABCA2 9 03/15/2012    No results found  for this basename: INR,  in the last 168 hours  No results found for this basename: UACOL, UAPR, USPG, UPH, UTP, UGL, UKET, UBIL, UHGB, UNIT, UROB, ULEU, UEPI, UWBC, URBC, UBAC, CAST, CRYS, UCOM, BILUA,  in the last 72 hours   STUDIES:  Mm Digital Diagnostic Bilat  04/18/2013   CLINICAL DATA:  Malignant lumpectomy of the left breast 2006 with subsequent chemotherapy and radiation therapy. Patient currently undergoing tamoxifen therapy.  EXAM: DIGITAL DIAGNOSTIC  BILATERAL MAMMOGRAM WITH CAD  COMPARISON:  None.  ACR Breast Density Category b: There are scattered areas of fibroglandular density.  FINDINGS: CC and MLO views of both breasts and a spot tangential view of the left breast at the lumpectomy site were obtained. Post lumpectomy scarring in the upper outer left breast. No findings suspicious for malignancy in either breast.  Mammographic images were processed with CAD.  IMPRESSION: No specific mammographic evidence of malignancy. Expected post lumpectomy changes in the left breast.  RECOMMENDATION: Screening mammogram in one year.(Code:[id number]-B-01Y)  I have discussed the findings and recommendations with the patient. Results were also provided in writing at the conclusion of the visit. If applicable, a reminder letter will be sent to the patient regarding the next appointment.  BI-RADS CATEGORY  2: Benign finding(s).   Electronically Signed   By: Evangeline Dakin M.D.   On: 04/18/2013 14:41     .ASSESSMENT:52 y.o.  BRCA 1-2 negative [place] woman   (1) status post left lumpectomy and sentinel lymph node biopsy July 2006 for a T1c N1,stage IIA invasive ductal carcinoma, grade 3, estrogen and progesterone receptor positive, HER2/neu negative,   (2) treated with dose dense doxorubicin and cyclophosphamide x4 followed by paclitaxel x4 also in dose dense fashion  (3) s/p adjuvant radiation  (4) on tamoxifen between March 2007 and March 2012 at which time she started letrozole, but with poor tolerance switched back to tamoxifen December 2012.   PLAN: Anetra Czerwinski continues to do well now 9 years out from her definitive surgery. She is tolerating the tamoxifen with no significant side effects. The plan is going to be to continue until March of 2017.  She is less stressed but more anxious now that she is unemployed. She is also having some soreness in her hips particularly when she overdoes a little bit at the gym. I think she would benefit from gabapentin on both counts taken at bedtime and I went ahead and sent a prescription for her. She will let me know if there are any side effects.  She wasn't sure she wanted to get a colonoscopy done at this point, this being 5 years instead of 10, and apparently they're not being a polyp associated with her colonoscopy 5 years ago. We reviewed the pathology report from 5 years ago which is indeed confusing. My suggestion to her low was to go ahead and get it done and benefits negative she doesn't have to do it until she is 26.  Anyelin knows to call for any problems that may develop before next visit here. Virgie Dad Perri Lamagna    04/24/2013

## 2013-04-24 NOTE — Telephone Encounter (Signed)
, °

## 2013-05-27 ENCOUNTER — Ambulatory Visit (INDEPENDENT_AMBULATORY_CARE_PROVIDER_SITE_OTHER): Payer: 59 | Admitting: Gynecology

## 2013-05-27 ENCOUNTER — Encounter: Payer: Self-pay | Admitting: Gynecology

## 2013-05-27 VITALS — BP 116/76 | Ht 64.0 in | Wt 153.0 lb

## 2013-05-27 DIAGNOSIS — Z01419 Encounter for gynecological examination (general) (routine) without abnormal findings: Secondary | ICD-10-CM

## 2013-05-27 DIAGNOSIS — Z853 Personal history of malignant neoplasm of breast: Secondary | ICD-10-CM

## 2013-05-27 NOTE — Patient Instructions (Signed)

## 2013-05-27 NOTE — Progress Notes (Signed)
Cassandra Dickson [birth year] [id number]   History:    53 y.o.  who presented to the office for her annual gynecological examination who has a history of left breast cancer poorly differentiated infiltrating ductal carcinoma status post lumpectomy and chemotherapy and radiation therapy in 2006. Patient was tested for BRCA1 and BRCA2 which were negative. Patient currently on extended tamoxifen as per oncologist. Patient last year had some bleeding as a result of her being on tamoxifen and had a benign endometrial biopsy. Patient reports no bleeding. This is patient's 9 year on tamoxifen. Patient's been complaining of vaginal dryness irritation and discomfort during intercourse. She had talked to her oncologist and he did not feel that it would be a problem for a low dose of vaginal estrogen tablet twice a week.   Review of her record indicated that in December 2011 she had a left ovarian cyst measuring 3.2 x 2.0 x 2.0 cm.  followup ultrasound 2013 complete resolution otherwise normal ultrasound with a thin endometrial stripe of 1.7 mm.  Review of her record also indicated in 2009 she had a cecal polyp. Her last bone density study was in 2013  which was normal. Her mammogram in April of this year was normal. Patient in her early 71s had mild dysplasia and was treated with cryotherapy and subsequent Pap smears have been normal.       Past medical history,surgical history, family history and social history were all reviewed and documented in the EPIC chart.  Gynecologic History Patient's last menstrual period was 03/21/2012. Contraception: condoms Last Pap: 2014. Results were: normal Last mammogram: 2015. Results were: normal  Obstetric History OB History  Gravida Para Term Preterm AB SAB TAB Ectopic Multiple Living  1 1  1      1     # Outcome Date GA Lbr Len/2nd Weight Sex Delivery Anes PTL Lv  1 PRE     M SVD  Y Y       ROS: A ROS was performed and pertinent positives and negatives are  included in the history.  GENERAL: No fevers or chills. HEENT: No change in vision, no earache, sore throat or sinus congestion. NECK: No pain or stiffness. CARDIOVASCULAR: No chest pain or pressure. No palpitations. PULMONARY: No shortness of breath, cough or wheeze. GASTROINTESTINAL: No abdominal pain, nausea, vomiting or diarrhea, melena or bright red blood per rectum. GENITOURINARY: No urinary frequency, urgency, hesitancy or dysuria. MUSCULOSKELETAL: No joint or muscle pain, no back pain, no recent trauma. DERMATOLOGIC: No rash, no itching, no lesions. ENDOCRINE: No polyuria, polydipsia, no heat or cold intolerance. No recent change in weight. HEMATOLOGICAL: No anemia or easy bruising or bleeding. NEUROLOGIC: No headache, seizures, numbness, tingling or weakness. PSYCHIATRIC: No depression, no loss of interest in normal activity or change in sleep pattern.     Exam: chaperone present  BP 116/76  Ht 5' 4"  (1.626 m)  Wt 153 lb (69.4 kg)  BMI 26.25 kg/m2  LMP 03/21/2012  Body mass index is 26.25 kg/(m^2).  General appearance : Well developed well nourished female. No acute distress HEENT: Neck supple, trachea midline, no carotid bruits, no thyroidmegaly Lungs: Clear to auscultation, no rhonchi or wheezes, or rib retractions  Heart: Regular rate and rhythm, no murmurs or gallops Breast:Examined in sitting and supine position were symmetrical in appearance, no palpable masses or tenderness,  no skin retraction, no nipple inversion, no nipple discharge, no skin discoloration, no axillary or supraclavicular lymphadenopathy Abdomen: no palpable masses or tenderness,  no rebound or guarding Extremities: no edema or skin discoloration or tenderness  Pelvic:  Bartholin, Urethra, Skene Glands: Within normal limits             Vagina: No gross lesions or discharge  Cervix: No gross lesions or discharge  Uterus  anteverted, normal size, shape and consistency, non-tender and mobile  Adnexa  Without  masses or tenderness  Anus and perineum  normal   Rectovaginal  normal sphincter tone without palpated masses or tenderness             Hemoccult colonoscopy this year normal     Assessment/Plan:  53 y.o. status post left lumpectomy and sentinel lymph node biopsy July 2006 for a T1c N1,stage IIA invasive ductal carcinoma, grade 3, estrogen and progesterone receptor positive, HER2/neu negative, treated with dose dense doxorubicin and cyclophosphamide x4 followed by paclitaxel x4. She was on tamoxifen between March 2007 and March 2012 at which time she started letrozole, but with poor tolerance switched back to tamoxifen December 2012 which was extended. Patient is taking her calcium and vitamin D. She'll have a bone density next year. We'll screen her with a pelvic ultrasound once a year. All her labs were drawn by her oncologist with the exception of a fasting lipid profile which she'll have done at the time of her ultrasound. She was reminded of the importance of calcium and vitamin D a regular exercise for osteoporosis prevention will also discuss importance of monthly breast exam. Pap smear was not done today in accordance to the new guidelines. I will talk to her oncologist in reference to a low dose 10 mcg estrogen tablet to apply intravaginally for severe vaginal atrophy. She stated she had discussed this with her oncologist and he had no reservation. The risks of the small but not 100% she fully understands and accepts. If we have his approval she will be prescribed Vagifem 10 mcg twice a week transvaginal.    Note: This dictation was prepared with  Dragon/digital dictation along withSmart phrase technology. Any transcriptional errors that result from this process are unintentional.   Terrance Mass MD, 2:42 PM 05/27/2013

## 2013-05-28 ENCOUNTER — Telehealth: Payer: Self-pay | Admitting: Gynecology

## 2013-05-28 ENCOUNTER — Telehealth: Payer: Self-pay | Admitting: *Deleted

## 2013-05-28 NOTE — Telephone Encounter (Signed)
Left message for pt to call.

## 2013-05-28 NOTE — Telephone Encounter (Signed)
Patient will be informed in reference to message below.,  No problem with intravaginal estrogen preparations in patients on tamoxifen; can't use them in patients on aromatase inhibitors-- so no problem w Cassandra Dickson-- thanks! -----     Message ----- From: Terrance Mass, MD Sent: 05/27/2013 2:53 PM To: Chauncey Cruel, MD [name], who is a mutual patient as you may recall she has a history of left lumpectomy and sentinel lymph node biopsy July 2006 for a T1c N1,stage IIA invasive ductal carcinoma, grade 3, estrogen and progesterone receptor positive, HER2/neu negative she is currently on tamoxifen. (BRCA1 and BRCA2 gene mutation negative). Who is suffering from vaginal atrophy and dyspareunia and she had mentioned that you had talked with her in reference to a low dose of vaginal estrogen. As you may well-known even in patients with intact uterus or using Vagifem 10 mcg twice a week did not need to be placed on progestational agent to protect the uterus because of very little if any absorption. If you feel comfortable with this I can call in a prescription for it to apply intravaginal twice a week. Thanks [place]

## 2013-05-28 NOTE — Telephone Encounter (Signed)
Message copied by Thamas Jaegers on Tue May 28, 2013 11:34 AM ------      Message from: Terrance Mass      Created: Tue May 28, 2013  8:58 AM       Anderson [place] please inform patient that I spoke with Dr. Jana Hakim and he felt that there would be no problem for her to go on vagifem 10 mcg tablet to apply vaginally twice a week. Please read her the message under telephone message. Call in 1 month supply with 11 refills. Thanks JF ------

## 2013-05-30 MED ORDER — ESTRADIOL 10 MCG VA TABS: 1.0000 | ORAL_TABLET | VAGINAL | Status: DC

## 2013-05-30 NOTE — Telephone Encounter (Signed)
Pt informed, rx sent 

## 2013-06-06 ENCOUNTER — Encounter: Payer: Self-pay | Admitting: Gynecology

## 2013-06-10 ENCOUNTER — Other Ambulatory Visit: Payer: 59

## 2013-06-11 ENCOUNTER — Other Ambulatory Visit: Payer: 59

## 2013-06-11 DIAGNOSIS — Z01419 Encounter for gynecological examination (general) (routine) without abnormal findings: Secondary | ICD-10-CM

## 2013-06-12 LAB — URINALYSIS W MICROSCOPIC + REFLEX CULTURE
Bacteria, UA: NONE SEEN
Bilirubin Urine: NEGATIVE
Casts: NONE SEEN
Crystals: NONE SEEN
Glucose, UA: NEGATIVE mg/dL
Ketones, ur: NEGATIVE mg/dL
LEUKOCYTES UA: NEGATIVE
NITRITE: NEGATIVE
PROTEIN: NEGATIVE mg/dL
Specific Gravity, Urine: 1.025 (ref 1.005–1.030)
Urobilinogen, UA: 0.2 mg/dL (ref 0.0–1.0)
pH: 5.5 (ref 5.0–8.0)

## 2013-06-12 LAB — LIPID PANEL
CHOL/HDL RATIO: 2.4 ratio
Cholesterol: 158 mg/dL (ref 0–200)
HDL: 66 mg/dL (ref >39)
LDL CALC: 77 mg/dL (ref 0–99)
Triglycerides: 77 mg/dL (ref <150)
VLDL: 15 mg/dL (ref 0–40)

## 2013-06-19 ENCOUNTER — Encounter: Payer: Self-pay | Admitting: Gynecology

## 2013-06-19 ENCOUNTER — Other Ambulatory Visit: Payer: Self-pay | Admitting: Gynecology

## 2013-06-19 ENCOUNTER — Ambulatory Visit (INDEPENDENT_AMBULATORY_CARE_PROVIDER_SITE_OTHER): Payer: 59

## 2013-06-19 ENCOUNTER — Ambulatory Visit (INDEPENDENT_AMBULATORY_CARE_PROVIDER_SITE_OTHER): Payer: 59 | Admitting: Gynecology

## 2013-06-19 VITALS — BP 124/78

## 2013-06-19 DIAGNOSIS — Z853 Personal history of malignant neoplasm of breast: Secondary | ICD-10-CM

## 2013-06-19 DIAGNOSIS — N83339 Acquired atrophy of ovary and fallopian tube, unspecified side: Secondary | ICD-10-CM

## 2013-06-19 DIAGNOSIS — Z8742 Personal history of other diseases of the female genital tract: Secondary | ICD-10-CM

## 2013-06-19 NOTE — Progress Notes (Signed)
   Patient presented to the office today to discuss her ultrasound. Patient was seen for her annual exam on 05/27/2013. Patient with past history of left breast cancer poorly differentiated infiltrating ductal carcinoma status post lumpectomy and chemotherapy and radiation therapy in 2006. Patient was tested for BRCA1 and BRCA2 which were negative. Patient currently on extended tamoxifen as per oncologist. Patient reports no vaginal bleeding last year she had some bleeding had a benign endometrial biopsy.  Review of her record indicated that in December 2011 she had a left ovarian cyst measuring 3.2 x 2.0 x 2.0 cm.  followup ultrasound 2013 complete resolution otherwise normal ultrasound with a thin endometrial stripe of 1.7 mm.   Ultrasound today: Uterus measures 6.4 x 4.8 x 2.6 cm with endometrial stripe at 2.1 mm. Thin endometrial cavity. Right and left lower normal. Atrophic ovaries. No fluid in the cul-de-sac. No apparent adnexal masses.  We reviewed her recent labs her CBC demonstrating hemoglobin 90.3 complex metabolic panel and fasting lipid profile were normal. Patient was reminded to begin taking one iron tablet daily. She has started Vagifem 10 mcg q. vaginally twice a week for vaginal atrophy and dyspareunia. This was discussed with her medical oncologist Dr. Jana Hakim who had no reservations since this medication there is minimal if any absorption. Patient fully understood and excess the risk although it may be small.

## 2013-09-24 ENCOUNTER — Other Ambulatory Visit: Payer: Self-pay | Admitting: Oncology

## 2013-11-11 ENCOUNTER — Encounter: Payer: Self-pay | Admitting: Gynecology

## 2014-01-22 ENCOUNTER — Other Ambulatory Visit: Payer: Self-pay | Admitting: *Deleted

## 2014-01-22 MED ORDER — GABAPENTIN 300 MG PO CAPS: 300.0000 mg | ORAL_CAPSULE | Freq: Every day | ORAL | Status: DC

## 2014-04-17 ENCOUNTER — Other Ambulatory Visit: Payer: 59

## 2014-04-23 ENCOUNTER — Telehealth: Payer: Self-pay | Admitting: Oncology

## 2014-04-23 NOTE — Telephone Encounter (Signed)
pt cld & left vm stating missed lab appt last week and wanted to know if she should CX-cld pt back left vm to adv that she could come to ;ab @3 :30 and keep GM appt because he was booked until [place] to call back to state if keepng appt

## 2014-04-24 ENCOUNTER — Other Ambulatory Visit: Payer: Self-pay

## 2014-04-24 ENCOUNTER — Telehealth: Payer: Self-pay | Admitting: Oncology

## 2014-04-24 ENCOUNTER — Ambulatory Visit (HOSPITAL_BASED_OUTPATIENT_CLINIC_OR_DEPARTMENT_OTHER): Payer: BLUE CROSS/BLUE SHIELD | Admitting: Oncology

## 2014-04-24 VITALS — BP 109/78 | HR 89 | Temp 98.1°F | Resp 18 | Ht 64.0 in | Wt 153.0 lb

## 2014-04-24 DIAGNOSIS — C50412 Malignant neoplasm of upper-outer quadrant of left female breast: Secondary | ICD-10-CM | POA: Diagnosis not present

## 2014-04-24 DIAGNOSIS — Z1231 Encounter for screening mammogram for malignant neoplasm of breast: Secondary | ICD-10-CM

## 2014-04-24 DIAGNOSIS — Z853 Personal history of malignant neoplasm of breast: Secondary | ICD-10-CM

## 2014-04-24 DIAGNOSIS — Z170 Estrogen receptor positive status [ER+]: Secondary | ICD-10-CM

## 2014-04-24 DIAGNOSIS — C50912 Malignant neoplasm of unspecified site of left female breast: Secondary | ICD-10-CM

## 2014-04-24 MED ORDER — TAMOXIFEN CITRATE 20 MG PO TABS: 20.0000 mg | ORAL_TABLET | Freq: Every day | ORAL | Status: DC

## 2014-04-24 NOTE — Progress Notes (Signed)
ID: Cassandra Dickson   DOB: 02-15-1960  MR#: [id number]  HWK#:[id number]  PCP: No primary care provider on file. GYN: Cassandra Dickson SU: Cassandra Dickson) OTHER MD: Cassandra Dickson, Cassandra Dickson  CHIEF COMPLAINT: Estrogen receptor positive breast cancer  CURRENT TREATMENT: Tamoxifen   HISTORY OF PRESENT ILLNESS: From the original intake note:  The patient herself palpated a mass in her left breast.  She was evaluated for this at [facility] with mammograms and ultrasound (June 21, 2004).  This showed a solid hypoechoic mass in the left breast, measuring approximately 11-mm by ultrasound.  The patient was referred to the [facility] for a biopsy under ultrasound guidance and this was performed July 01, 2004.  It showed ([id number] and [id number]) a poorly- differentiated infiltrating ductal carcinoma, strongly ER positive and PR positive, Hercept test 2+, negative by FISH.    With this information, the patient was referred to Dr. Annamaria Boots and a breast MRI was obtained showing a solitary lesion in the left breast.  Accordingly, on July 22, 2004 the patient underwent a left lumpectomy and sentinel lymph node biopsy under Cassandra Dickson.  The final pathology ([id number]) confirmed the presence of a grade 3, infiltrating ductal carcinoma measuring 1.7-cm.  There was evidence of lymphovascular invasion.  The margins were negative.  The single sentinel lymph node showed a micrometastatic focus measuring approximately 1/3 of a millimeter. The cells showed evidence of mitotic activity and therefore were read as a micrometastatic deposit, not as isolated tumor cells.  However H&E was negative.   Her subsequent history is as detailed below  INTERVAL HISTORY: Cassandra Dickson returns today for follow-up of her breast cancer. Since her last visit here she married in soon we will change her name to [employer]. She continues on tamoxifen with excellent tolerance. Hot flashes and vaginal dryness or discharge are not problems. She  obtains it at an excellent cost. Incidentally she was not able to get her lab work rescheduled before this visit so it will be done tomorrow.  REVIEW OF SYSTEMS: The tamoxifen allows her to use vaginal estradiol safely. This will be an issue when she goes off tamoxifen a year from now. She is exercising regularly several days a week at the gym and also by walking. A detailed review of systems today was otherwise entirely negative  PAST MEDICAL HISTORY: Past Medical History  Diagnosis Date  . NSVD (normal spontaneous vaginal delivery)   . BRCA1 negative   . BRCA2 negative   . Cancer 06/2004    LEFT BREAST CANCER.[facility] RADIATION / CHEMO    PAST SURGICAL HISTORY: Past Surgical History  Procedure Laterality Date  . Colonoscopy  06/2007    BENIGN CECAL POLYP   . Breast surgery  2006    LEFT LUMPECTOMY    FAMILY HISTORY Family History  Problem Relation Age of Onset  . Cancer Father     LUNG  . Hypertension Maternal Aunt   . Diabetes Maternal Uncle   . Breast cancer Maternal Grandmother 80  . Breast cancer Maternal Aunt 46  . Pancreatic cancer Maternal Aunt   [name] died from small-cell lung cancer at the age of 43.  He was a smoker.  The patient's mother is alive, in her 67's.  The patient has one brother and no sisters.  One of her mother's sisters and her mother's mother had a diagnosis of breast cancer, the  maternal aunt, approximately at age 42, her maternal grandmother in her old age.  There  is no history of ovarian cancer in the family.   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 She is divorced, and after sharing her home with her [place] for 12 years she finally married this year. He works in Press photographer. He has 3 children from a prior marriage, 2 in college, one a graduate living in [place]. [name]'s son [place],  is currently a sophomore at [employer]  The patient is a Psychologist, forensic.      ADVANCED DIRECTIVES: Not in place  HEALTH MAINTENANCE: History  Substance Use Topics  . Smoking status: Never Smoker   . Smokeless tobacco: Never Used  . Alcohol Use: Yes     Comment: WINE      Colonoscopy: May 2015  PAP:  Bone density:  Lipid panel:  No Known Allergies  Current Outpatient Prescriptions  Medication Sig Dispense Refill  . calcium carbonate (OS-CAL) 600 MG TABS Take 600 mg by mouth 2 (two) times daily with a meal.    . cholecalciferol (VITAMIN D) 1000 UNITS tablet Take 1,000 Units by mouth daily.    . Cyanocobalamin (B-12 PO) Take by mouth.    . Estradiol 10 MCG TABS vaginal tablet Place 1 tablet (10 mcg total) vaginally 2 (two) times a week. 8 tablet 11  . gabapentin (NEURONTIN) 300 MG capsule Take 1 capsule (300 mg total) by mouth at bedtime. 30 capsule 12  . Melatonin 1 MG CAPS Take by mouth.    . Multiple Vitamin (MULTIVITAMIN) tablet Take 1 tablet by mouth daily.    . Omega-3 Fatty Acids (FISH OIL PO) Take by mouth.    . tamoxifen (NOLVADEX) 20 MG tablet TAKE 1 TABLET BY MOUTH EVERY DAY 90 tablet 2   No current facility-administered medications for this visit.    OBJECTIVE: [name] in no acute distress Filed Vitals:   04/24/14 1547  BP: 109/78  Pulse: 89  Temp: 98.1 F (36.7 C)  Resp: 18     Body mass index is 26.25 kg/(m^2).    ECOG FS: 0  Sclerae unicteric, pupils round and equal Oropharynx clear, good dentition No cervical or supraclavicular adenopathy Lungs no rales or rhonchi Heart regular rate and rhythm Abd soft, nontender, positive bowel sounds MSK no focal spinal tenderness, no upper extremity lymphedema Neuro: nonfocal, well oriented, positive affect Breasts: The right breast is unremarkable. The left breast is status post lumpectomy and radiation. There is no evidence of local recurrence. The left axilla is benign.  LAB RESULTS: Lab Results   Component Value Date   WBC 7.0 04/15/2013   NEUTROABS 4.5 04/15/2013   HGB 11.7 04/15/2013   HCT 35.5 04/15/2013   MCV 86.5 04/15/2013   PLT 251 04/15/2013      Chemistry      Component Value Date/Time   NA 141 04/15/2013 1610   NA 139 02/28/2011 1603   K 4.1 04/15/2013 1610   K 4.1 02/28/2011 1603   CL 104 03/15/2012 1615   CL 105 02/28/2011 1603   CO2 24 04/15/2013 1610   CO2 26 02/28/2011 1603   BUN 18.4 04/15/2013 1610   BUN 16 02/28/2011 1603   CREATININE 1.1 04/15/2013 1610   CREATININE 0.92 02/28/2011 1603      Component Value Date/Time   CALCIUM 9.3 04/15/2013 1610   CALCIUM 9.1 02/28/2011 1603   ALKPHOS 51  04/15/2013 1610   ALKPHOS 67 02/28/2011 1603   AST 16 04/15/2013 1610   AST 16 02/28/2011 1603   ALT 9 04/15/2013 1610   ALT 11 02/28/2011 1603   BILITOT 0.27 04/15/2013 1610   BILITOT 0.1* 02/28/2011 1603       Lab Results  Component Value Date   LABCA2 9 03/15/2012    No results for input(s): INR in the last 168 hours.  No results for input(s): UHGB, CRYS in the last 72 hours.  Invalid input(s): UACOL, UAPR, USPG, UPH, UTP, UGL, UKET, UBIL, UNIT, UROB, ULEU, UEPI, UWBC, URBC, UBAC, CAST, UCOM, BILUA   STUDIES: Mammography this year scheduled for next week  .ASSESSMENT:53 y.o.  BRCA 1-2 negative [place] woman   (1) status post left lumpectomy and sentinel lymph node biopsy July 2006 for a T1c N1,stage IIA invasive ductal carcinoma, grade 3, estrogen and progesterone receptor positive, HER2/neu negative,   (2) treated with dose dense doxorubicin and cyclophosphamide x4 followed by paclitaxel x4 also in dose dense fashion  (3) s/p adjuvant radiation  (4) on tamoxifen between March 2007 and March 2012 at which time she started letrozole, but with poor tolerance switched back to tamoxifen December 2012.   PLAN: Artesha Wemhoff is doing terrific now nearly 9 years out from her definitive surgery, with no evidence of disease recurrence. We are  going to do tamoxifen one more year.   If we stop tamoxifen then, as planned, then I am going to feel uncomfortable with her use of vaginal estrogen preparations. Today we discussed the Josph Macho procedure. This is something that I need to learn more about but which may be available for her when I see her next year to take care of the dyspareunia problem  Otherwise she knows to call for any problems that may develop before that visit.     Sumayya Muha C    04/24/2014

## 2014-04-24 NOTE — Telephone Encounter (Signed)
per pof to sch pt appt-gave pt copy of sch °

## 2014-04-25 ENCOUNTER — Other Ambulatory Visit: Payer: Self-pay | Admitting: *Deleted

## 2014-04-25 ENCOUNTER — Other Ambulatory Visit (HOSPITAL_BASED_OUTPATIENT_CLINIC_OR_DEPARTMENT_OTHER): Payer: BLUE CROSS/BLUE SHIELD

## 2014-04-25 DIAGNOSIS — C50912 Malignant neoplasm of unspecified site of left female breast: Secondary | ICD-10-CM

## 2014-04-25 DIAGNOSIS — C50412 Malignant neoplasm of upper-outer quadrant of left female breast: Secondary | ICD-10-CM | POA: Diagnosis not present

## 2014-04-25 LAB — CBC & DIFF AND RETIC
BASO%: 0.2 % (ref 0.0–2.0)
BASOS ABS: 0 10*3/uL (ref 0.0–0.1)
EOS ABS: 0.1 10*3/uL (ref 0.0–0.5)
EOS%: 1.4 % (ref 0.0–7.0)
HCT: 36.4 % (ref 34.8–46.6)
HEMOGLOBIN: 12 g/dL (ref 11.6–15.9)
Immature Retic Fract: 2.7 % (ref 1.60–10.00)
LYMPH%: 32.2 % (ref 14.0–49.7)
MCH: 28.8 pg (ref 25.1–34.0)
MCHC: 33 g/dL (ref 31.5–36.0)
MCV: 87.3 fL (ref 79.5–101.0)
MONO#: 0.4 10*3/uL (ref 0.1–0.9)
MONO%: 6.3 % (ref 0.0–14.0)
NEUT%: 59.9 % (ref 38.4–76.8)
NEUTROS ABS: 3.9 10*3/uL (ref 1.5–6.5)
NRBC: 0 % (ref 0–0)
PLATELETS: 272 10*3/uL (ref 145–400)
RBC: 4.17 10*6/uL (ref 3.70–5.45)
RDW: 13 % (ref 11.2–14.5)
RETIC %: 1.5 % (ref 0.70–2.10)
RETIC CT ABS: 62.55 10*3/uL (ref 33.70–90.70)
WBC: 6.6 10*3/uL (ref 3.9–10.3)
lymph#: 2.1 10*3/uL (ref 0.9–3.3)

## 2014-04-25 LAB — COMPREHENSIVE METABOLIC PANEL (CC13)
ALK PHOS: 49 U/L (ref 40–150)
ALT: 14 U/L (ref 0–55)
AST: 18 U/L (ref 5–34)
Albumin: 4.1 g/dL (ref 3.5–5.0)
Anion Gap: 12 mEq/L — ABNORMAL HIGH (ref 3–11)
BILIRUBIN TOTAL: 0.3 mg/dL (ref 0.20–1.20)
BUN: 19.1 mg/dL (ref 7.0–26.0)
CO2: 25 meq/L (ref 22–29)
CREATININE: 0.9 mg/dL (ref 0.6–1.1)
Calcium: 7.7 mg/dL — ABNORMAL LOW (ref 8.4–10.4)
Chloride: 103 mEq/L (ref 98–109)
EGFR: 69 mL/min/{1.73_m2} — ABNORMAL LOW (ref >90)
Glucose: 104 mg/dl (ref 70–140)
Potassium: 4.2 mEq/L (ref 3.5–5.1)
Sodium: 140 mEq/L (ref 136–145)
TOTAL PROTEIN: 7.1 g/dL (ref 6.4–8.3)

## 2014-05-01 ENCOUNTER — Ambulatory Visit: Payer: Self-pay

## 2014-05-19 ENCOUNTER — Ambulatory Visit
Admission: RE | Admit: 2014-05-19 | Discharge: 2014-05-19 | Disposition: A | Discharge status: Home / Self Care | Payer: BLUE CROSS/BLUE SHIELD | Source: Ambulatory Visit

## 2014-05-19 DIAGNOSIS — Z1231 Encounter for screening mammogram for malignant neoplasm of breast: Secondary | ICD-10-CM

## 2014-07-09 ENCOUNTER — Encounter: Payer: Self-pay | Admitting: Genetic Counselor

## 2014-07-25 ENCOUNTER — Telehealth: Payer: Self-pay | Admitting: *Deleted

## 2014-07-25 DIAGNOSIS — C50912 Malignant neoplasm of unspecified site of left female breast: Secondary | ICD-10-CM

## 2014-07-25 MED ORDER — TAMOXIFEN CITRATE 20 MG PO TABS: 20.0000 mg | ORAL_TABLET | Freq: Every day | ORAL | Status: DC

## 2014-07-25 NOTE — Telephone Encounter (Signed)
TC from patient requesting refill on her tamoxifen. Done.

## 2014-07-28 NOTE — Telephone Encounter (Signed)
Thanks

## 2014-09-03 ENCOUNTER — Ambulatory Visit (INDEPENDENT_AMBULATORY_CARE_PROVIDER_SITE_OTHER): Payer: BLUE CROSS/BLUE SHIELD | Admitting: Gynecology

## 2014-09-03 ENCOUNTER — Encounter: Payer: Self-pay | Admitting: Gynecology

## 2014-09-03 VITALS — BP 118/76 | Ht 63.5 in | Wt 153.0 lb

## 2014-09-03 DIAGNOSIS — Z01419 Encounter for gynecological examination (general) (routine) without abnormal findings: Secondary | ICD-10-CM | POA: Diagnosis not present

## 2014-09-03 DIAGNOSIS — Z780 Asymptomatic menopausal state: Secondary | ICD-10-CM

## 2014-09-03 DIAGNOSIS — Z853 Personal history of malignant neoplasm of breast: Secondary | ICD-10-CM | POA: Diagnosis not present

## 2014-09-04 ENCOUNTER — Encounter: Payer: Self-pay | Admitting: Gynecology

## 2014-09-04 MED ORDER — ESTRADIOL 10 MCG VA TABS: 1.0000 | ORAL_TABLET | VAGINAL | Status: DC

## 2014-09-04 NOTE — Progress Notes (Signed)
Cassandra Dickson October 19, 1960 [id number]   History:    53 y.o.  for annual gyn exam with no complaints. Patient with a past history of left breast cancer poorly differentiated infiltrating ductal carcinoma status post lumpectomy and chemotherapy and radiation therapy in 2006. Patient was tested for BRCA1 and BRCA2 which were negative. Patient currently on extended tamoxifen as per oncologist. Patient last year had some bleeding as a result of her being on tamoxifen and had a benign endometrial biopsy. Patient reports no bleeding. This is patient's 10th and final year of tamoxifen. Patient last year to complain of vaginal dryness and discomfort and we had talked her oncologist and he did not feel that it would be a problem with low-dose vaginal estrogen twice a week such as Vagifem 10 g.  Review of her record indicated that in December 2011 she had a left ovarian cyst measuring 3.2 x 2.0 x 2.0 cm.  followup ultrasound 2013 complete resolution otherwise normal ultrasound with a thin endometrial stripe of 1.7 mm. her last bone density study was normal in 2013.Patient in her early 25s had mild dysplasia and was treated with cryotherapy and subsequent Pap smears have been normal.   Past medical history,surgical history, family history and social history were all reviewed and documented in the EPIC chart.  Gynecologic History Patient's last menstrual period was 03/21/2012. Contraception: post menopausal status Last Pap: 2014. Results were: normal Last mammogram: 2016. Results were: normal  Obstetric History OB History  Gravida Para Term Preterm AB SAB TAB Ectopic Multiple Living  _0 # Outcome Date GA Lbr Len/2nd Weight Sex Delivery Anes PTL Lv  1 Preterm     M Vag-Spont  Y Y       ROS: A ROS was performed and pertinent positives and negatives are included in the history.  GENERAL: No fevers or chills. HEENT: No change in vision, no earache, sore throat or sinus congestion. NECK:  No pain or stiffness. CARDIOVASCULAR: No chest pain or pressure. No palpitations. PULMONARY: No shortness of breath, cough or wheeze. GASTROINTESTINAL: No abdominal pain, nausea, vomiting or diarrhea, melena or bright red blood per rectum. GENITOURINARY: No urinary frequency, urgency, hesitancy or dysuria. MUSCULOSKELETAL: No joint or muscle pain, no back pain, no recent trauma. DERMATOLOGIC: No rash, no itching, no lesions. ENDOCRINE: No polyuria, polydipsia, no heat or cold intolerance. No recent change in weight. HEMATOLOGICAL: No anemia or easy bruising or bleeding. NEUROLOGIC: No headache, seizures, numbness, tingling or weakness. PSYCHIATRIC: No depression, no loss of interest in normal activity or change in sleep pattern.     Exam: chaperone present  BP 118/76 mmHg  Ht 5' 3.5" (1.613 m)  Wt 153 lb (69.4 kg)  BMI 26.67 kg/m2  LMP 03/21/2012  Body mass index is 26.67 kg/(m^2).  General appearance : Well developed well nourished female. No acute distress HEENT: Eyes: no retinal hemorrhage or exudates,  Neck supple, trachea midline, no carotid bruits, no thyroidmegaly Lungs: Clear to auscultation, no rhonchi or wheezes, or rib retractions  Heart: Regular rate and rhythm, no murmurs or gallops Breast:Examined in sitting and supine position were symmetrical in appearance, no palpable masses or tenderness,  no skin retraction, no nipple inversion, no nipple discharge, no skin discoloration, no axillary or supraclavicular lymphadenopathy Abdomen: no palpable masses or tenderness, no rebound or guarding Extremities: no edema or skin discoloration or tenderness  Pelvic:  Bartholin, Urethra, Skene Glands: Within normal limits  Vagina: No gross lesions or discharge  Cervix: No gross lesions or discharge  Uterus  anteverted, normal size, shape and consistency, non-tender and mobile  Adnexa  Without masses or tenderness  Anus and perineum  normal   Rectovaginal  normal sphincter tone  without palpated masses or tenderness             Hemoccult will provide cards     Assessment/Plan:  54 y.o. female for annual exam who is status post left lumpectomy and sentinel lymph node biopsy July 2006 for a T1c N1,stage IIA invasive ductal carcinoma, grade 3, estrogen and progesterone receptor positive, HER2/neu negative, treated with dose dense doxorubicin and cyclophosphamide x4 followed by paclitaxel x4. She was on tamoxifen between March 2007 and March 2012 at which time she started letrozole, but with poor tolerance switched back to tamoxifen December 2012 which was extended. Patient is taking her calcium and vitamin D. Patient to schedule her bone density study for this year.We'll screen her with a pelvic ultrasound once a year. All her labs were drawn by her oncologist with the exception of a fasting lipid profile which she'll have done at the time of her ultrasound. She was reminded of the importance of calcium and vitamin D a regular exercise for osteoporosis prevention will also discuss importance of monthly breast exam. Patient had been started on Vagifem 10 g twice weekly vaginal atrophy after cleared by her medical oncologist in prescription refill was provided.   Terrance Mass MD, 11:21 AM 09/04/2014

## 2014-09-25 ENCOUNTER — Other Ambulatory Visit: Payer: Self-pay | Admitting: Gynecology

## 2014-09-25 ENCOUNTER — Encounter: Payer: Self-pay | Admitting: Gynecology

## 2014-09-25 ENCOUNTER — Ambulatory Visit (INDEPENDENT_AMBULATORY_CARE_PROVIDER_SITE_OTHER): Payer: BLUE CROSS/BLUE SHIELD | Admitting: Gynecology

## 2014-09-25 ENCOUNTER — Ambulatory Visit (INDEPENDENT_AMBULATORY_CARE_PROVIDER_SITE_OTHER): Payer: BLUE CROSS/BLUE SHIELD

## 2014-09-25 VITALS — BP 122/78

## 2014-09-25 DIAGNOSIS — Z79810 Long term (current) use of selective estrogen receptor modulators (SERMs): Secondary | ICD-10-CM

## 2014-09-25 DIAGNOSIS — N83319 Acquired atrophy of ovary, unspecified side: Secondary | ICD-10-CM

## 2014-09-25 DIAGNOSIS — Z853 Personal history of malignant neoplasm of breast: Secondary | ICD-10-CM

## 2014-09-25 DIAGNOSIS — Z780 Asymptomatic menopausal state: Secondary | ICD-10-CM | POA: Diagnosis not present

## 2014-09-25 DIAGNOSIS — N8331 Acquired atrophy of ovary: Secondary | ICD-10-CM | POA: Diagnosis not present

## 2014-09-25 NOTE — Progress Notes (Signed)
   Patient is a 54 year old who presented to the office to discuss her ultrasound. Patient with pasthistory of left breast cancer poorly differentiated infiltrating ductal carcinoma status post lumpectomy and chemotherapy and radiation therapy in 2006. Patient was tested for BRCA1 and BRCA2 which were negative. Patient currently on extended tamoxifen as per oncologist. Patient last year had some bleeding as a result of her being on tamoxifen and had a benign endometrial biopsy. Patient reports no bleeding. This is patient's 10th and final year of tamoxifen. Patient last year to complain of vaginal dryness and discomfort and we had talked her oncologist and he did not feel that it would be a problem with low-dose vaginal estrogen twice a week such as Vagifem 10 g. patient stated she is not using the Vagifem because of cost.  Review of her record indicated that in December 2011 she had a left ovarian cyst measuring 3.2 x 2.0 x 2.0 cm.  followup ultrasound 2013 complete resolution otherwise normal ultrasound with a thin endometrial stripe of 1.7 mm. patient is otherwise asymptomatic today.  Ultrasound today: Uterus measures 7.0 x 4.5 x 2.7 cm with endometrial stripe of 3.8 mm. Right and left ovary normal echo pattern atrophic. No adnexal masses.  Assessment/plan: Patient with past history of breast cancer complaining tamoxifen with no vaginal bleeding this year has done well. Pelvic ultrasound normal. Patient scheduled for bone density study next few weeks and will have her flu vaccine then.

## 2014-11-10 ENCOUNTER — Other Ambulatory Visit: Payer: Self-pay | Admitting: Gynecology

## 2014-11-10 ENCOUNTER — Ambulatory Visit (INDEPENDENT_AMBULATORY_CARE_PROVIDER_SITE_OTHER): Payer: BLUE CROSS/BLUE SHIELD | Admitting: *Deleted

## 2014-11-10 ENCOUNTER — Ambulatory Visit (INDEPENDENT_AMBULATORY_CARE_PROVIDER_SITE_OTHER): Payer: BLUE CROSS/BLUE SHIELD

## 2014-11-10 DIAGNOSIS — Z23 Encounter for immunization: Secondary | ICD-10-CM

## 2014-11-10 DIAGNOSIS — Z780 Asymptomatic menopausal state: Secondary | ICD-10-CM | POA: Diagnosis not present

## 2014-11-10 DIAGNOSIS — Z13820 Encounter for screening for osteoporosis: Secondary | ICD-10-CM

## 2015-05-06 ENCOUNTER — Other Ambulatory Visit: Payer: Self-pay | Admitting: *Deleted

## 2015-05-06 DIAGNOSIS — C50912 Malignant neoplasm of unspecified site of left female breast: Secondary | ICD-10-CM

## 2015-05-07 ENCOUNTER — Other Ambulatory Visit (HOSPITAL_BASED_OUTPATIENT_CLINIC_OR_DEPARTMENT_OTHER): Payer: BLUE CROSS/BLUE SHIELD

## 2015-05-07 DIAGNOSIS — C50412 Malignant neoplasm of upper-outer quadrant of left female breast: Secondary | ICD-10-CM

## 2015-05-07 DIAGNOSIS — C50912 Malignant neoplasm of unspecified site of left female breast: Secondary | ICD-10-CM

## 2015-05-07 LAB — COMPREHENSIVE METABOLIC PANEL
ALT: 13 U/L (ref 0–55)
AST: 17 U/L (ref 5–34)
Albumin: 4.1 g/dL (ref 3.5–5.0)
Alkaline Phosphatase: 56 U/L (ref 40–150)
Anion Gap: 7 mEq/L (ref 3–11)
BUN: 21.5 mg/dL (ref 7.0–26.0)
CHLORIDE: 103 meq/L (ref 98–109)
CO2: 28 meq/L (ref 22–29)
Calcium: 9.4 mg/dL (ref 8.4–10.4)
Creatinine: 1 mg/dL (ref 0.6–1.1)
EGFR: 64 mL/min/{1.73_m2} — AB (ref >90)
Glucose: 109 mg/dl (ref 70–140)
POTASSIUM: 4.2 meq/L (ref 3.5–5.1)
Sodium: 139 mEq/L (ref 136–145)
Total Bilirubin: 0.34 mg/dL (ref 0.20–1.20)
Total Protein: 7.4 g/dL (ref 6.4–8.3)

## 2015-05-07 LAB — CBC WITH DIFFERENTIAL/PLATELET
BASO%: 0.4 % (ref 0.0–2.0)
Basophils Absolute: 0 10*3/uL (ref 0.0–0.1)
EOS%: 1.1 % (ref 0.0–7.0)
Eosinophils Absolute: 0.1 10*3/uL (ref 0.0–0.5)
HCT: 36.7 % (ref 34.8–46.6)
HGB: 11.9 g/dL (ref 11.6–15.9)
LYMPH%: 26.1 % (ref 14.0–49.7)
MCH: 27.9 pg (ref 25.1–34.0)
MCHC: 32.5 g/dL (ref 31.5–36.0)
MCV: 85.9 fL (ref 79.5–101.0)
MONO#: 0.3 10*3/uL (ref 0.1–0.9)
MONO%: 5 % (ref 0.0–14.0)
NEUT#: 4.5 10*3/uL (ref 1.5–6.5)
NEUT%: 67.4 % (ref 38.4–76.8)
PLATELETS: 256 10*3/uL (ref 145–400)
RBC: 4.28 10*6/uL (ref 3.70–5.45)
RDW: 13.6 % (ref 11.2–14.5)
WBC: 6.7 10*3/uL (ref 3.9–10.3)
lymph#: 1.8 10*3/uL (ref 0.9–3.3)

## 2015-05-14 ENCOUNTER — Ambulatory Visit (HOSPITAL_BASED_OUTPATIENT_CLINIC_OR_DEPARTMENT_OTHER): Payer: BLUE CROSS/BLUE SHIELD | Admitting: Oncology

## 2015-05-14 ENCOUNTER — Telehealth: Payer: Self-pay | Admitting: Oncology

## 2015-05-14 ENCOUNTER — Other Ambulatory Visit: Payer: BLUE CROSS/BLUE SHIELD

## 2015-05-14 VITALS — BP 125/52 | HR 79 | Temp 98.4°F | Resp 18 | Ht 63.5 in | Wt 152.3 lb

## 2015-05-14 DIAGNOSIS — Z170 Estrogen receptor positive status [ER+]: Secondary | ICD-10-CM | POA: Diagnosis not present

## 2015-05-14 DIAGNOSIS — C50912 Malignant neoplasm of unspecified site of left female breast: Secondary | ICD-10-CM | POA: Diagnosis not present

## 2015-05-14 MED ORDER — ESTRADIOL 10 MCG VA TABS: 1.0000 | ORAL_TABLET | VAGINAL | Status: DC

## 2015-05-14 MED ORDER — GABAPENTIN 300 MG PO CAPS: 300.0000 mg | ORAL_CAPSULE | Freq: Every day | ORAL | Status: DC

## 2015-05-14 NOTE — Telephone Encounter (Signed)
Left messase to inform patient od Oct 5 appt at 430 pm

## 2015-05-14 NOTE — Progress Notes (Signed)
ID: Cassandra Dickson   DOB: 1960-11-17  MR#: [id number]  VQQ#:[id number]  PCP: No PCP Per Patient GYN: Uvaldo Rising SU: Marylene Buerger) OTHER MD: Tyler Pita, Jamie Kato  CHIEF COMPLAINT: Estrogen receptor positive breast cancer  CURRENT TREATMENT: completing 10 years of tamoxifen   HISTORY OF PRESENT ILLNESS: From the original intake note:  The patient herself palpated a mass in her left breast.  She was evaluated for this at [facility] with mammograms and ultrasound (June 21, 2004).  This showed a solid hypoechoic mass in the left breast, measuring approximately 11-mm by ultrasound.  The patient was referred to the [facility] for a biopsy under ultrasound guidance and this was performed July 01, 2004.  It showed ([id number] and [id number]) a poorly- differentiated infiltrating ductal carcinoma, strongly ER positive and PR positive, Hercept test 2+, negative by FISH.    With this information, the patient was referred to Dr. Annamaria Boots and a breast MRI was obtained showing a solitary lesion in the left breast.  Accordingly, on July 22, 2004 the patient underwent a left lumpectomy and sentinel lymph node biopsy under Marylene Buerger.  The final pathology ([id number]) confirmed the presence of a grade 3, infiltrating ductal carcinoma measuring 1.7-cm.  There was evidence of lymphovascular invasion.  The margins were negative.  The single sentinel lymph node showed a micrometastatic focus measuring approximately 1/3 of a millimeter. The cells showed evidence of mitotic activity and therefore were read as a micrometastatic deposit, not as isolated tumor cells.  However H&E was negative.   Her subsequent history is as detailed below  INTERVAL HISTORY:   Cassandra Dickson returns today for follow-up of her estrogen receptor positive breast cancer. The interval history is unremarkable. She continues on tamoxifen, which she takes "like water". She also obtains it at a good price  REVIEW OF SYSTEMS: She exercises  regularly, going to the gym about 3 times a week and walking other days. She also gardens. A detailed review of systems today was otherwise stable  PAST MEDICAL HISTORY: Past Medical History  Diagnosis Date  . NSVD (normal spontaneous vaginal delivery)   . BRCA1 negative   . BRCA2 negative   . Cancer 06/2004    LEFT BREAST CANCER.[facility]IATION / CHEMO    PAST SURGICAL HISTORY: Past Surgical History  Procedure Laterality Date  . Colonoscopy  06/2007    BENIGN CECAL POLYP   . Breast surgery  2006    LEFT LUMPECTOMY    FAMILY HISTORY Family History  Problem Relation Age of Onset  . Cancer Father     LUNG  . Hypertension Maternal Aunt   . Diabetes Maternal Uncle   . Breast cancer Maternal Grandmother 80  . Breast cancer Maternal Aunt 64  . Pancreatic cancer Maternal Aunt   [name] died from small-cell lung cancer at the age of 35.  He was a smoker.  The patient's mother is alive, in her 48's.  The patient has one brother and no sisters.  One of her mother's sisters and her mother's mother had a diagnosis of breast cancer, the  maternal aunt, approximately at age 20, her maternal grandmother in her old age.  There is no history of ovarian cancer in the family.   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 She is divorced, and after sharing her home with  her Vinco for 12 years they married 2016. He works in Press photographer. He has 3 children from a prior marriage, 2 in college, one a graduate living in [place]. Cassandra Dickson, is currently a Paramedic at [employer]  The patient is a Psychologist, forensic.      ADVANCED DIRECTIVES: Not in place  HEALTH MAINTENANCE: Social History  Substance Use Topics  . Smoking status: Never Smoker   . Smokeless tobacco: Never Used  . Alcohol Use: 0.0 oz/week    0 Standard drinks or equivalent per week      Comment: WINE      Colonoscopy: May 2015  PAP:  Bone density:  Lipid panel:  No Known Allergies  Current Outpatient Prescriptions  Medication Sig Dispense Refill  . Biotin 5 MG CAPS Take 1 capsule (5 mg total) by mouth daily.  0  . CALCIUM-MAGNESUIUM-ZINC 333-133-8.3 MG TABS Take 1 tablet by mouth 2 (two) times daily.  0  . cholecalciferol (VITAMIN D) 1000 UNITS tablet Take 1,000 Units by mouth daily.    . Cyanocobalamin (B-12 PO) Take by mouth.    . Estradiol 10 MCG TABS vaginal tablet Place 1 tablet (10 mcg total) vaginally 2 (two) times a week. 8 tablet 11  . gabapentin (NEURONTIN) 300 MG capsule Take 1 capsule (300 mg total) by mouth at bedtime. 30 capsule 12  . Melatonin 1 MG CAPS Take by mouth.    . Multiple Vitamin (MULTIVITAMIN) tablet Take 1 tablet by mouth daily.    . Omega-3 Fatty Acids (FISH OIL PO) Take by mouth.    . tamoxifen (NOLVADEX) 20 MG tablet Take 1 tablet (20 mg total) by mouth daily. 90 tablet 3   No current facility-administered medications for this visit.    OBJECTIVE: [name] who appears well  Filed Vitals:   05/14/15 1516  BP: 125/52  Pulse: 79  Temp: 98.4 F (36.9 C)  Resp: 18     Body mass index is 26.55 kg/(m^2).    ECOG FS: 0  Sclerae unicteric, EOMs intact Oropharynx clear, dentition in good repair No cervical or supraclavicular adenopathy Lungs no rales or rhonchi Heart regular rate and rhythm Abd soft, nontender, positive bowel sounds MSK no focal spinal tenderness, no upper extremity lymphedema Neuro: nonfocal, well oriented, appropriate affect Breasts: the right breast is unremarkable. Left breast is status post lumpectomy and radiation. There is no evidence of local recurrence. The left axilla is benign. The  LAB RESULTS: Lab Results  Component Value Date   WBC 6.7 05/07/2015   NEUTROABS 4.5 05/07/2015   HGB 11.9 05/07/2015   HCT 36.7 05/07/2015   MCV 85.9 05/07/2015   PLT 256 05/07/2015      Chemistry       Component Value Date/Time   NA 139 05/07/2015 1533   NA 139 02/28/2011 1603   K 4.2 05/07/2015 1533   K 4.1 02/28/2011 1603   CL 104 03/15/2012 1615   CL 105 02/28/2011 1603   CO2 28 05/07/2015 1533   CO2 26 02/28/2011 1603   BUN 21.5 05/07/2015 1533   BUN 16 02/28/2011 1603   CREATININE 1.0 05/07/2015 1533   CREATININE 0.92 02/28/2011 1603      Component Value Date/Time   CALCIUM 9.4 05/07/2015 1533   CALCIUM 9.1 02/28/2011 1603   ALKPHOS 56 05/07/2015 1533   ALKPHOS 67 02/28/2011 1603   AST 17 05/07/2015 1533   AST 16 02/28/2011 1603   ALT 13 05/07/2015 1533  ALT 11 02/28/2011 1603   BILITOT 0.34 05/07/2015 1533   BILITOT 0.1* 02/28/2011 1603       Lab Results  Component Value Date   LABCA2 9 03/15/2012    No results for input(s): INR in the last 168 hours.  No results for input(s): UHGB, CRYS in the last 72 hours.  Invalid input(s): UACOL, UAPR, USPG, UPH, UTP, UGL, UKET, UBIL, UNIT, UROB, ULEU, UEPI, UWBC, URBC, UBAC, CAST, UCOM, BILUA   STUDIES: Mammography is scheduled for next week at the [facility]  .ASSESSMENT:54 y.o.  BRCA 1-2 negative [place] woman   (1) status post left lumpectomy and sentinel lymph node biopsy July 2006 for a T1c N1,stage IIA invasive ductal carcinoma, grade 3, estrogen and progesterone receptor positive, HER2/neu negative,   (2) treated with dose dense doxorubicin and cyclophosphamide x4 followed by paclitaxel x4 also in dose dense fashion  (3) s/p adjuvant radiation  (4) on tamoxifen between March 2007 and March 2012 at which time she started letrozole, but with poor tolerance switched back to tamoxifen December 2012.   PLAN:  Shalah is now 10-1/2 years out from definitive surgery for her breast cancer, with no evidence of recurrence. This is very favorable.  She has completed 10 years of anti-estrogens, which is optimal.  I do not have data for continuing tamoxifen beyond 10 years. The plan accordingly is to stop  that medication.  Krystle would like to continue estrogen replacement vaginally. We discussed this at length. She understands that in women with a history of breast cancer we have good data that estrogen alone does not cause breast cancer. We also expect that while some estrogen is likely to be absorbed systemically through the vaginal wall, the amounts are so small as to be hard to measure.  The uncertainty comes in because we don't have really good data for using vaginal estrogen preparations in women with a history of breast cancer. In situations like hers, when she is losing the protection of tamoxifen, it becomes a question of balancing a possible small risk versus the benefit of a healthier vaginal wall. The latter can be very important not only in terms of general.health, but also in terms of intimacy questions.  After this discussion and with a good understanding of what is involved, Sharlee would like me to refill her estradiol vaginal suppositories which I'm glad to do. Since I will not see her further on a routine basis, additional refills will have to go through her gynecologist Dr. Toney Rakes. He and I have discussed this issue many times and have the same approach to this situation  As far as breast cancer screening is concerned all Jaydan will need is yearly screening mammography and a yearly physician breast exam. Also she is participating in our survivorship program and I am making that referral.  I will be glad to see her again at any point in the future if the need arises, but as of now I am making no further routine appointments for her with me here.    Inna Tisdell C    05/14/2015

## 2015-05-15 ENCOUNTER — Other Ambulatory Visit: Payer: Self-pay

## 2015-05-15 DIAGNOSIS — Z1231 Encounter for screening mammogram for malignant neoplasm of breast: Secondary | ICD-10-CM

## 2015-06-10 ENCOUNTER — Ambulatory Visit
Admission: RE | Admit: 2015-06-10 | Discharge: 2015-06-10 | Disposition: A | Discharge status: Home / Self Care | Payer: BLUE CROSS/BLUE SHIELD | Source: Ambulatory Visit

## 2015-06-10 DIAGNOSIS — Z1231 Encounter for screening mammogram for malignant neoplasm of breast: Secondary | ICD-10-CM

## 2015-07-03 ENCOUNTER — Encounter: Payer: Self-pay | Admitting: Genetic Counselor

## 2015-09-22 ENCOUNTER — Telehealth: Payer: Self-pay | Admitting: Adult Health

## 2015-09-22 NOTE — Telephone Encounter (Signed)
10/15/2015 Appointment canceled per patient request. Patient does not plan to reschedule.

## 2015-10-15 ENCOUNTER — Encounter: Payer: BLUE CROSS/BLUE SHIELD | Admitting: Nurse Practitioner

## 2015-10-15 ENCOUNTER — Encounter: Payer: BLUE CROSS/BLUE SHIELD | Admitting: Adult Health

## 2015-11-05 ENCOUNTER — Ambulatory Visit (INDEPENDENT_AMBULATORY_CARE_PROVIDER_SITE_OTHER): Payer: BLUE CROSS/BLUE SHIELD | Admitting: Gynecology

## 2015-11-05 ENCOUNTER — Encounter: Payer: Self-pay | Admitting: Gynecology

## 2015-11-05 VITALS — BP 126/74 | Ht 64.0 in | Wt 151.0 lb

## 2015-11-05 DIAGNOSIS — Z853 Personal history of malignant neoplasm of breast: Secondary | ICD-10-CM

## 2015-11-05 DIAGNOSIS — Z23 Encounter for immunization: Secondary | ICD-10-CM | POA: Diagnosis not present

## 2015-11-05 DIAGNOSIS — Z01411 Encounter for gynecological examination (general) (routine) with abnormal findings: Secondary | ICD-10-CM

## 2015-11-05 NOTE — Progress Notes (Signed)
Cassandra Dickson 08-Jan-1961 [id number]   History:    55 y.o.  for annual gyn exam with no complaints today. Patient wishing to have the flu vaccine today.Patient with pasthistory of left breast cancer poorly differentiated infiltrating ductal carcinoma status post lumpectomy and chemotherapy and radiation therapy in 2006. Patient was tested for BRCA1 and BRCA2 which were negative. Patient completed her tamoxifen this year after 10 years. Patient reports no vaginal bleeding. Patient 2 years ago had complained of vaginal dryness and discomfort and we had talked with her oncologist and he did not feel that it would be a problem with low-dose vaginal estrogen twice a week such as Vagifem 10 g. patient stated she is not using the Vagifem because of cost. Patient had a normal colonoscopy in 2015 and her bone densities normal in 2016.  Past medical history,surgical history, family history and social history were all reviewed and documented in the EPIC chart.  Gynecologic History Patient's last menstrual period was 03/21/2012. Contraception: post menopausal status Last Pap: 2014. Results were: normal Last mammogram: 2017. Results were: normal  Obstetric History OB History  Gravida Para Term Preterm AB Living  _0 SAB TAB Ectopic Multiple Live Births          1    # Outcome Date GA Lbr Len/2nd Weight Sex Delivery Anes PTL Lv  1 Preterm     M Vag-Spont  Y LIV       ROS: A ROS was performed and pertinent positives and negatives are included in the history.  GENERAL: No fevers or chills. HEENT: No change in vision, no earache, sore throat or sinus congestion. NECK: No pain or stiffness. CARDIOVASCULAR: No chest pain or pressure. No palpitations. PULMONARY: No shortness of breath, cough or wheeze. GASTROINTESTINAL: No abdominal pain, nausea, vomiting or diarrhea, melena or bright red blood per rectum. GENITOURINARY: No urinary frequency, urgency, hesitancy or dysuria. MUSCULOSKELETAL: No joint  or muscle pain, no back pain, no recent trauma. DERMATOLOGIC: No rash, no itching, no lesions. ENDOCRINE: No polyuria, polydipsia, no heat or cold intolerance. No recent change in weight. HEMATOLOGICAL: No anemia or easy bruising or bleeding. NEUROLOGIC: No headache, seizures, numbness, tingling or weakness. PSYCHIATRIC: No depression, no loss of interest in normal activity or change in sleep pattern.     Exam: chaperone present  BP 126/74   Ht _1  (1.626 m)   Wt 151 lb (68.5 kg)   LMP 03/21/2012   BMI 25.92 kg/m   Body mass index is 25.92 kg/m.  General appearance : Well developed well nourished female. No acute distress HEENT: Eyes: no retinal hemorrhage or exudates,  Neck supple, trachea midline, no carotid bruits, no thyroidmegaly Lungs: Clear to auscultation, no rhonchi or wheezes, or rib retractions  Heart: Regular rate and rhythm, no murmurs or gallops Breast:Examined in sitting and supine position were symmetrical in appearance, no palpable masses or tenderness,  no skin retraction, no nipple inversion, no nipple discharge, no skin discoloration, no axillary or supraclavicular lymphadenopathy Abdomen: no palpable masses or tenderness, no rebound or guarding Extremities: no edema or skin discoloration or tenderness  Pelvic:  Bartholin, Urethra, Skene Glands: Within normal limits             Vagina: No gross lesions or discharge  Cervix: No gross lesions or discharge  Uterus  anteverted, normal size, shape and consistency, non-tender and mobile  Adnexa  Without masses or tendernessstatus   Anus and  perineum  normal   Rectovaginal  normal sphincter tone without palpated masses or tenderness             Hemoccult cards will be provided     Assessment/Plan:  55 y.o. female for annual exam who is post left lumpectomy and sentinel lymph node biopsy July 2006 for a T1c N1,stage IIA invasive ductal carcinoma, grade 3, estrogen and progesterone receptor positive, HER2/neu  negative, treated with dose dense doxorubicin and cyclophosphamide x4 followed by paclitaxel x4. Patient completed 2 years of tamoxifen this year. Patient with no vaginal bleeding. We discussed importance of calcium vitamin D and weightbearing exercise for osteoporosis prevention. For close surveillance with doing an ultrasound every year for ovaries last year was normal. Pap smear was done today. PCP is been doing her blood work with the exception of a fasting lipid profile TSH and urinalysis which we drawn when she comes in for ultrasound. Patient received the flu vaccine today.    Terrance Mass MD, 4:18 PM 11/05/2015

## 2015-11-05 NOTE — Patient Instructions (Signed)

## 2015-11-06 LAB — URINALYSIS W MICROSCOPIC + REFLEX CULTURE
Bacteria, UA: NONE SEEN [HPF]
Bilirubin Urine: NEGATIVE
CASTS: NONE SEEN [LPF]
Crystals: NONE SEEN [HPF]
Glucose, UA: NEGATIVE
KETONES UR: NEGATIVE
Leukocytes, UA: NEGATIVE
NITRITE: NEGATIVE
PH: 6 (ref 5.0–8.0)
Protein, ur: NEGATIVE
SPECIFIC GRAVITY, URINE: 1.028 (ref 1.001–1.035)
WBC UA: NONE SEEN WBC/HPF (ref <=5)
Yeast: NONE SEEN [HPF]

## 2015-11-06 LAB — PAP IG W/ RFLX HPV ASCU

## 2015-11-07 LAB — URINE CULTURE

## 2015-11-09 ENCOUNTER — Other Ambulatory Visit: Payer: Self-pay | Admitting: *Deleted

## 2015-11-09 DIAGNOSIS — R319 Hematuria, unspecified: Secondary | ICD-10-CM

## 2015-11-10 ENCOUNTER — Other Ambulatory Visit: Payer: BLUE CROSS/BLUE SHIELD

## 2015-11-10 LAB — LIPID PANEL
CHOLESTEROL: 157 mg/dL (ref 125–200)
HDL: 61 mg/dL (ref >=46)
LDL Cholesterol: 84 mg/dL (ref <130)
TRIGLYCERIDES: 60 mg/dL (ref <150)
Total CHOL/HDL Ratio: 2.6 Ratio (ref <=5.0)
VLDL: 12 mg/dL (ref <30)

## 2015-11-10 LAB — TSH: TSH: 4.01 mIU/L

## 2015-11-18 ENCOUNTER — Ambulatory Visit (INDEPENDENT_AMBULATORY_CARE_PROVIDER_SITE_OTHER): Payer: BLUE CROSS/BLUE SHIELD

## 2015-11-18 ENCOUNTER — Ambulatory Visit (INDEPENDENT_AMBULATORY_CARE_PROVIDER_SITE_OTHER): Payer: BLUE CROSS/BLUE SHIELD | Admitting: Gynecology

## 2015-11-18 ENCOUNTER — Encounter: Payer: Self-pay | Admitting: Gynecology

## 2015-11-18 VITALS — BP 130/82

## 2015-11-18 DIAGNOSIS — Z853 Personal history of malignant neoplasm of breast: Secondary | ICD-10-CM

## 2015-11-18 DIAGNOSIS — R319 Hematuria, unspecified: Secondary | ICD-10-CM | POA: Diagnosis not present

## 2015-11-18 DIAGNOSIS — Z8742 Personal history of other diseases of the female genital tract: Secondary | ICD-10-CM

## 2015-11-18 LAB — URINALYSIS W MICROSCOPIC + REFLEX CULTURE
BILIRUBIN URINE: NEGATIVE
CASTS: NONE SEEN [LPF]
CRYSTALS: NONE SEEN [HPF]
Glucose, UA: NEGATIVE
KETONES UR: NEGATIVE
Leukocytes, UA: NEGATIVE
NITRITE: NEGATIVE
PROTEIN: NEGATIVE
Specific Gravity, Urine: 1.015 (ref 1.001–1.035)
YEAST: NONE SEEN [HPF]
pH: 5.5 (ref 5.0–8.0)

## 2015-11-18 NOTE — Progress Notes (Signed)
   Patient is a 55 year old who was seen in the office on October 26 for her anal exam. The reason she is in the office today for ultrasound discussion since she has been monitored yearly because of history of breast cancer. Patient had left breast cancer poorly differentiated infiltrating ductal carcinoma status post lumpectomy and chemotherapy and radiation therapy in 2006. Patient was tested for BRCA1 and BRCA2 which were negative. Patient completed her tamoxifen this year after 10 years. Patient reports no vaginal bleeding. Also she was asked to have her urine checked because at time of her annual exam her urinalysis had demonstrated 1+ hemoglobin and her urine cultures and did not identify single isolated organism that required any treatment. Her urinalysis the previous yearly had trace blood in her urine. Patient denied any dysuria or any frequency or any fever, chills, nausea or vomiting. She did mention that many years ago she has seen a urologist but did not recall for what. She has no history of kidney stones. She was asymptomatic today.  Urinalysis today demonstrated 10-20 RBC, 0-5 WBC and moderate bacteria a urine culture pending  Ultrasound uterus measures 7.4 x 4.0 x 2.7 cm with endometrial stripe of 2.0 mm. Right and left ovary normal. 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  Assessment/plan: Patient with past history of breast cancer completed tamoxifen doing well normal ultrasound today. Urine culture pending in the event of urinary tract infection. If it microorganism is not identified because of patient's persistent microhematuria she will be referred to the urologist.  Greater than 90% time was spent calcium [place] care for this patient. Time of consultation 15 minutes

## 2015-11-20 LAB — URINE CULTURE

## 2015-11-25 ENCOUNTER — Telehealth: Payer: Self-pay | Admitting: *Deleted

## 2015-11-25 NOTE — Telephone Encounter (Signed)
Please inform patient that urine culture was negative and she continues to have microscopic hematuria and this is the reason I wanted her to be seen by the urologist.  Pt informed with the below note, [facility] will fax me back with a time and date.

## 2015-12-10 NOTE — Telephone Encounter (Signed)
Appointment on 01/14/16 @ 1:15pm with Dr.MacDiarmid left message for pt to call.

## 2015-12-14 NOTE — Telephone Encounter (Signed)
Pt informed with the below note. 

## 2016-05-25 ENCOUNTER — Encounter: Payer: Self-pay | Admitting: Gynecology

## 2016-05-31 ENCOUNTER — Other Ambulatory Visit: Payer: Self-pay | Admitting: *Deleted

## 2016-05-31 MED ORDER — GABAPENTIN 300 MG PO CAPS: 300.0000 mg | ORAL_CAPSULE | Freq: Every day | ORAL | 4 refills | Status: DC

## 2016-08-04 ENCOUNTER — Other Ambulatory Visit: Payer: Self-pay | Admitting: Gynecology

## 2016-08-04 DIAGNOSIS — Z1231 Encounter for screening mammogram for malignant neoplasm of breast: Secondary | ICD-10-CM

## 2016-08-16 ENCOUNTER — Other Ambulatory Visit: Payer: Self-pay | Admitting: Obstetrics & Gynecology

## 2016-08-16 ENCOUNTER — Ambulatory Visit
Admission: RE | Admit: 2016-08-16 | Discharge: 2016-08-16 | Disposition: A | Discharge status: Home / Self Care | Payer: BLUE CROSS/BLUE SHIELD | Source: Ambulatory Visit | Attending: Gynecology | Admitting: Gynecology

## 2016-08-16 DIAGNOSIS — Z1231 Encounter for screening mammogram for malignant neoplasm of breast: Secondary | ICD-10-CM

## 2016-08-16 HISTORY — DX: Personal history of antineoplastic chemotherapy: Z92.21

## 2016-08-16 HISTORY — DX: Personal history of irradiation: Z92.3

## 2016-11-15 ENCOUNTER — Encounter: Payer: Self-pay | Admitting: Obstetrics & Gynecology

## 2016-11-15 ENCOUNTER — Ambulatory Visit (INDEPENDENT_AMBULATORY_CARE_PROVIDER_SITE_OTHER): Payer: No Typology Code available for payment source | Admitting: Obstetrics & Gynecology

## 2016-11-15 ENCOUNTER — Other Ambulatory Visit: Payer: Self-pay | Admitting: Gynecology

## 2016-11-15 VITALS — BP 120/78 | Ht 63.5 in | Wt 153.0 lb

## 2016-11-15 DIAGNOSIS — Z23 Encounter for immunization: Secondary | ICD-10-CM

## 2016-11-15 DIAGNOSIS — C50912 Malignant neoplasm of unspecified site of left female breast: Secondary | ICD-10-CM

## 2016-11-15 DIAGNOSIS — Z170 Estrogen receptor positive status [ER+]: Secondary | ICD-10-CM

## 2016-11-15 DIAGNOSIS — Z780 Asymptomatic menopausal state: Secondary | ICD-10-CM | POA: Diagnosis not present

## 2016-11-15 DIAGNOSIS — Z13820 Encounter for screening for osteoporosis: Secondary | ICD-10-CM

## 2016-11-15 DIAGNOSIS — Z01419 Encounter for gynecological examination (general) (routine) without abnormal findings: Secondary | ICD-10-CM | POA: Diagnosis not present

## 2016-11-15 NOTE — Progress Notes (Signed)
Cassandra Dickson 02-Apr-1960 [id number]   History:    56 y.o. G1P1L1 Remarried.  Husband has 3 children.  RP:  Established patient presenting for annual gyn exam   HPI: History of left breast cancer diagnosed in 2006.  Was on tamoxifen for 10 years.  BRCA1 and BRCA2 negative.  BMI 26.68  Past medical history,surgical history, family history and social history were all reviewed and documented in the EPIC chart.  Gynecologic History Patient's last menstrual period was 03/21/2012. Contraception: post menopausal status Last Pap: 2017. Results were: normal Last mammogram: 2018. Results were: normal Colon 2015 Dexa 2016  Obstetric History OB History  Gravida Para Term Preterm AB Living  _0 SAB TAB Ectopic Multiple Live Births          1    # Outcome Date GA Lbr Len/2nd Weight Sex Delivery Anes PTL Lv  1 Preterm     M Vag-Spont  Y LIV       ROS: A ROS was performed and pertinent positives and negatives are included in the history.  GENERAL: No fevers or chills. HEENT: No change in vision, no earache, sore throat or sinus congestion. NECK: No pain or stiffness. CARDIOVASCULAR: No chest pain or pressure. No palpitations. PULMONARY: No shortness of breath, cough or wheeze. GASTROINTESTINAL: No abdominal pain, nausea, vomiting or diarrhea, melena or bright red blood per rectum. GENITOURINARY: No urinary frequency, urgency, hesitancy or dysuria. MUSCULOSKELETAL: No joint or muscle pain, no back pain, no recent trauma. DERMATOLOGIC: No rash, no itching, no lesions. ENDOCRINE: No polyuria, polydipsia, no heat or cold intolerance. No recent change in weight. HEMATOLOGICAL: No anemia or easy bruising or bleeding. NEUROLOGIC: No headache, seizures, numbness, tingling or weakness. PSYCHIATRIC: No depression, no loss of interest in normal activity or change in sleep pattern.     Exam:   BP 120/78   Ht 5' 3.5" (1.613 m)   Wt 153 lb (69.4 kg)   LMP 03/21/2012   BMI 26.68 kg/m   Body  mass index is 26.68 kg/m.  General appearance : Well developed well nourished female. No acute distress HEENT: Eyes: no retinal hemorrhage or exudates,  Neck supple, trachea midline, no carotid bruits, no thyroidmegaly Lungs: Clear to auscultation, no rhonchi or wheezes, or rib retractions  Heart: Regular rate and rhythm, no murmurs or gallops Breast:Examined in sitting and supine position were symmetrical in appearance, no palpable masses or tenderness,  no skin retraction, no nipple inversion, no nipple discharge, no skin discoloration, no axillary or supraclavicular lymphadenopathy Abdomen: no palpable masses or tenderness, no rebound or guarding Extremities: no edema or skin discoloration or tenderness  Pelvic: Vulva normal  Bartholin, Urethra, Skene Glands: Within normal limits             Vagina: No gross lesions or discharge  Cervix: No gross lesions or discharge.  Pap reflex done.  Uterus  AV, normal size, shape and consistency, non-tender and mobile  Adnexa  Without masses or tenderness  Anus and perineum  normal    Assessment/Plan:  56 y.o. female for annual exam   1. Encounter for routine gynecological examination with Papanicolaou smear of cervix Normal gynecologic exam.  Pap reflex done.  Breast exam normal.  Recent mammogram negative.  Up-to-date with colonoscopy.  Fasting labs today. - CBC - Comp Met (CMET) - TSH - Lipid panel; Future - Vitamin D 1,25 dihydroxy; Future  2. Menopause present Menopause well-tolerated.  No hormone  replacement therapy.  No postmenopausal bleeding.  Vitamin D supplements.  Calcium and food.  Weightbearing physical activity recommended.  Will follow up for bone density. - DG Bone Density; Future  3. Malignant neoplasm of left breast in female, estrogen receptor positive, unspecified site of breast ([place]) Diagnosis of left breast cancer in 2006.  Patient finished 10 years of tamoxifen.  BRCA 1 and BRCA2 negative.  Will follow up for pelvic  ultrasound to screen for ovarian pathology.  Last year's pelvic ultrasound was negative with a thin endometrial lining at 2 mm. - US Transvaginal Non-OB; Future  Princess Bruins MD, 2:28 PM 11/15/2016

## 2016-11-17 LAB — PAP IG W/ RFLX HPV ASCU

## 2016-11-18 ENCOUNTER — Other Ambulatory Visit: Payer: No Typology Code available for payment source

## 2016-11-20 NOTE — Patient Instructions (Signed)
1. Encounter for routine gynecological examination with Papanicolaou smear of cervix Normal gynecologic exam.  Pap reflex done.  Breast exam normal.  Recent mammogram negative.  Up-to-date with colonoscopy.  Fasting labs today. - CBC - Comp Met (CMET) - TSH - Lipid panel; Future - Vitamin D 1,25 dihydroxy; Future  2. Menopause present Menopause well-tolerated.  No hormone replacement therapy.  No postmenopausal bleeding.  Vitamin D supplements.  Calcium and food.  Weightbearing physical activity recommended.  Will follow up for bone density. - DG Bone Density; Future  3. Malignant neoplasm of left breast in female, estrogen receptor positive, unspecified site of breast ([facility]) Diagnosis of left breast cancer in 2006.  Patient finished 10 years of tamoxifen.  BRCA 1 and BRCA2 negative.  Will follow up for pelvic ultrasound to screen for ovarian pathology.  Last year's pelvic ultrasound was negative with a thin endometrial lining at 2 mm. - US Transvaginal Non-OB; Future  Cassandra Dickson, it was a pleasure meeting you today.  I will inform you of all your lab results as soon as available.   Health Maintenance for Postmenopausal Women Menopause is a normal process in which your reproductive ability comes to an end. This process happens gradually over a span of months to years, usually between the ages of 29 and 62. Menopause is complete when you have missed 12 consecutive menstrual periods. It is important to talk with your health care provider about some of the most common conditions that affect postmenopausal women, such as heart disease, cancer, and bone loss (osteoporosis). Adopting a healthy lifestyle and getting preventive care can help to promote your health and wellness. Those actions can also lower your chances of developing some of these common conditions. What should I know about menopause? During menopause, you may experience a number of symptoms, such as:  Moderate-to-severe hot  flashes.  Night sweats.  Decrease in sex drive.  Mood swings.  Headaches.  Tiredness.  Irritability.  Memory problems.  Insomnia.  Choosing to treat or not to treat menopausal changes is an individual decision that you make with your health care provider. What should I know about hormone replacement therapy and supplements? Hormone therapy products are effective for treating symptoms that are associated with menopause, such as hot flashes and night sweats. Hormone replacement carries certain risks, especially as you become older. If you are thinking about using estrogen or estrogen with progestin treatments, discuss the benefits and risks with your health care provider. What should I know about heart disease and stroke? Heart disease, heart attack, and stroke become more likely as you age. This may be due, in part, to the hormonal changes that your body experiences during menopause. These can affect how your body processes dietary fats, triglycerides, and cholesterol. Heart attack and stroke are both medical emergencies. There are many things that you can do to help prevent heart disease and stroke:  Have your blood pressure checked at least every 1-2 years. High blood pressure causes heart disease and increases the risk of stroke.  If you are 42-62 years old, ask your health care provider if you should take aspirin to prevent a heart attack or a stroke.  Do not use any tobacco products, including cigarettes, chewing tobacco, or electronic cigarettes. If you need help quitting, ask your health care provider.  It is important to eat a healthy diet and maintain a healthy weight. ? Be sure to include plenty of vegetables, fruits, low-fat dairy products, and lean protein. ? Avoid eating foods  that are high in solid fats, added sugars, or salt (sodium).  Get regular exercise. This is one of the most important things that you can do for your health. ? Try to exercise for at least 150  minutes each week. The type of exercise that you do should increase your heart rate and make you sweat. This is known as moderate-intensity exercise. ? Try to do strengthening exercises at least twice each week. Do these in addition to the moderate-intensity exercise.  Know your numbers.Ask your health care provider to check your cholesterol and your blood glucose. Continue to have your blood tested as directed by your health care provider.  What should I know about cancer screening? There are several types of cancer. Take the following steps to reduce your risk and to catch any cancer development as early as possible. Breast Cancer  Practice breast self-awareness. ? This means understanding how your breasts normally appear and feel. ? It also means doing regular breast self-exams. Let your health care provider know about any changes, no matter how small.  If you are 15 or older, have a clinician do a breast exam (clinical breast exam or CBE) every year. Depending on your age, family history, and medical history, it may be recommended that you also have a yearly breast X-ray (mammogram).  If you have a family history of breast cancer, talk with your health care provider about genetic screening.  If you are at high risk for breast cancer, talk with your health care provider about having an MRI and a mammogram every year.  Breast cancer (BRCA) gene test is recommended for women who have family members with BRCA-related cancers. Results of the assessment will determine the need for genetic counseling and BRCA1 and for BRCA2 testing. BRCA-related cancers include these types: ? Breast. This occurs in males or females. ? Ovarian. ? Tubal. This may also be called fallopian tube cancer. ? Cancer of the abdominal or pelvic lining (peritoneal cancer). ? Prostate. ? Pancreatic.  Cervical, Uterine, and Ovarian Cancer Your health care provider may recommend that you be screened regularly for cancer  of the pelvic organs. These include your ovaries, uterus, and vagina. This screening involves a pelvic exam, which includes checking for microscopic changes to the surface of your cervix (Pap test).  For women ages 21-65, health care providers may recommend a pelvic exam and a Pap test every three years. For women ages 55-65, they may recommend the Pap test and pelvic exam, combined with testing for human papilloma virus (HPV), every five years. Some types of HPV increase your risk of cervical cancer. Testing for HPV may also be done on women of any age who have unclear Pap test results.  Other health care providers may not recommend any screening for nonpregnant women who are considered low risk for pelvic cancer and have no symptoms. Ask your health care provider if a screening pelvic exam is right for you.  If you have had past treatment for cervical cancer or a condition that could lead to cancer, you need Pap tests and screening for cancer for at least 20 years after your treatment. If Pap tests have been discontinued for you, your risk factors (such as having a new sexual partner) need to be reassessed to determine if you should start having screenings again. Some women have medical problems that increase the chance of getting cervical cancer. In these cases, your health care provider may recommend that you have screening and Pap tests more often.  If you have a family history of uterine cancer or ovarian cancer, talk with your health care provider about genetic screening.  If you have vaginal bleeding after reaching menopause, tell your health care provider.  There are currently no reliable tests available to screen for ovarian cancer.  Lung Cancer Lung cancer screening is recommended for adults 60-69 years old who are at high risk for lung cancer because of a history of smoking. A yearly low-dose CT scan of the lungs is recommended if you:  Currently smoke.  Have a history of at least 30  pack-years of smoking and you currently smoke or have quit within the past 15 years. A pack-year is smoking an average of one pack of cigarettes per day for one year.  Yearly screening should:  Continue until it has been 15 years since you quit.  Stop if you develop a health problem that would prevent you from having lung cancer treatment.  Colorectal Cancer  This type of cancer can be detected and can often be prevented.  Routine colorectal cancer screening usually begins at age 35 and continues through age 9.  If you have risk factors for colon cancer, your health care provider may recommend that you be screened at an earlier age.  If you have a family history of colorectal cancer, talk with your health care provider about genetic screening.  Your health care provider may also recommend using home test kits to check for hidden blood in your stool.  A small camera at the end of a tube can be used to examine your colon directly (sigmoidoscopy or colonoscopy). This is done to check for the earliest forms of colorectal cancer.  Direct examination of the colon should be repeated every 5-10 years until age 69. However, if early forms of precancerous polyps or small growths are found or if you have a family history or genetic risk for colorectal cancer, you may need to be screened more often.  Skin Cancer  Check your skin from head to toe regularly.  Monitor any moles. Be sure to tell your health care provider: ? About any new moles or changes in moles, especially if there is a change in a mole's shape or color. ? If you have a mole that is larger than the size of a pencil eraser.  If any of your family members has a history of skin cancer, especially at a young age, talk with your health care provider about genetic screening.  Always use sunscreen. Apply sunscreen liberally and repeatedly throughout the day.  Whenever you are outside, protect yourself by wearing long sleeves, pants,  a wide-brimmed hat, and sunglasses.  What should I know about osteoporosis? Osteoporosis is a condition in which bone destruction happens more quickly than new bone creation. After menopause, you may be at an increased risk for osteoporosis. To help prevent osteoporosis or the bone fractures that can happen because of osteoporosis, the following is recommended:  If you are 58-30 years old, get at least 1,000 mg of calcium and at least 600 mg of vitamin D per day.  If you are older than age 50 but younger than age 76, get at least 1,200 mg of calcium and at least 600 mg of vitamin D per day.  If you are older than age 71, get at least 1,200 mg of calcium and at least 800 mg of vitamin D per day.  Smoking and excessive alcohol intake increase the risk of osteoporosis. Eat foods that are rich  in calcium and vitamin D, and do weight-bearing exercises several times each week as directed by your health care provider. What should I know about how menopause affects my mental health? Depression may occur at any age, but it is more common as you become older. Common symptoms of depression include:  Low or sad mood.  Changes in sleep patterns.  Changes in appetite or eating patterns.  Feeling an overall lack of motivation or enjoyment of activities that you previously enjoyed.  Frequent crying spells.  Talk with your health care provider if you think that you are experiencing depression. What should I know about immunizations? It is important that you get and maintain your immunizations. These include:  Tetanus, diphtheria, and pertussis (Tdap) booster vaccine.  Influenza every year before the flu season begins.  Pneumonia vaccine.  Shingles vaccine.  Your health care provider may also recommend other immunizations. This information is not intended to replace advice given to you by your health care provider. Make sure you discuss any questions you have with your health care  provider. Document Released: 02/18/2005 Document Revised: 07/17/2015 Document Reviewed: 09/30/2014 Elsevier Interactive Patient Education  2018 [employer].

## 2016-11-21 ENCOUNTER — Encounter: Payer: Self-pay | Admitting: *Deleted

## 2016-12-08 ENCOUNTER — Ambulatory Visit: Payer: No Typology Code available for payment source | Admitting: Obstetrics & Gynecology

## 2016-12-08 ENCOUNTER — Other Ambulatory Visit: Payer: No Typology Code available for payment source

## 2017-04-03 ENCOUNTER — Telehealth: Payer: Self-pay | Admitting: *Deleted

## 2017-04-03 NOTE — Telephone Encounter (Signed)
Pt called had annual in Nov 2018 called asking if estradiol vaginal tablets 10 mcg twice weekly can be re-prescribed for vaginal dryness? Please advise

## 2017-04-04 NOTE — Telephone Encounter (Signed)
Yes agree for a represcription of Vagifem generic twice a week until next Annual/Gyn exam.

## 2017-04-05 NOTE — Telephone Encounter (Signed)
Left message for pt to call.

## 2017-04-05 NOTE — Telephone Encounter (Signed)
Pt called back and said to hold off on Rx as she has some vaginal irritation, is going to schedule visit with provider due to this.

## 2017-04-25 ENCOUNTER — Encounter: Payer: Self-pay | Admitting: Obstetrics & Gynecology

## 2017-04-25 ENCOUNTER — Ambulatory Visit (INDEPENDENT_AMBULATORY_CARE_PROVIDER_SITE_OTHER): Payer: BLUE CROSS/BLUE SHIELD | Admitting: Obstetrics & Gynecology

## 2017-04-25 VITALS — BP 126/78

## 2017-04-25 DIAGNOSIS — N763 Subacute and chronic vulvitis: Secondary | ICD-10-CM

## 2017-04-25 DIAGNOSIS — N904 Leukoplakia of vulva: Secondary | ICD-10-CM

## 2017-04-25 MED ORDER — CLOBETASOL PROPIONATE 0.05 % EX OINT: 1.0000 "application " | TOPICAL_OINTMENT | Freq: Two times a day (BID) | CUTANEOUS | 4 refills | Status: AC

## 2017-04-25 NOTE — Progress Notes (Addendum)
    Cassandra Dickson July 19, 1960 [id number]        57 y.o.  G1P0101 Remarried.  RP: Vulvar irritation and redness with pain  HPI: Menopause on no hormone replacement therapy.  Has used Vagifem in the past but not recently.  No postmenopausal bleeding.  History of estrogen receptor positive breast cancer.  Presenting for severe vulvar irritation and redness for a few months.  Patient complains of a burning pain at the vulva.  Has not been able to be sexually active because of the pain, in spite of using a lubricant.  Last time she tried to be sexually active was January 2019.  Per patient, no exposure to new chemicals.  No vaginal discharge.  No pelvic pain.  No urinary incontinence.  No fever.   OB History  Gravida Para Term Preterm AB Living  1 1   1   1   SAB TAB Ectopic Multiple Live Births          1    # Outcome Date GA Lbr Len/2nd Weight Sex Delivery Anes PTL Lv  1 Preterm     M Vag-Spont  Y LIV    Past medical history,surgical history, problem list, medications, allergies, family history and social history were all reviewed and documented in the EPIC chart.   Directed ROS with pertinent positives and negatives documented in the history of present illness/assessment and plan.  Exam:  Vitals:   04/25/17 1240  BP: 126/78   General appearance:  Normal  Gynecologic exam: Vulva: Severe erythema throughout, starting on the inside of the large labia and involving the small labia.  Vulvar atrophy especially of the posterior small labia.  Very thin skin.  Mild irritation in the perianal area.  No discrete lesion.  No increased vaginal discharge.   Assessment/Plan:  57 y.o. G1P0101   1. Subacute vulvitis Possible contact vulvitis, but more probably Lichen sclerosis.  Avoid irritants.  May soak in warm water.  Avoid rubbing.  Will start on clobetasol ointment 5.46% thin application on the affected vulva twice a day for 2 weeks, then decrease to daily applications.  We will follow-up in 3  weeks to reassess the vulva and decide on long-term management as indicated.  2. Lichen sclerosus et atrophicus of the vulva Management as above.  Will decide if a vulvar biopsy is needed per exam at follow-up.  Other orders - clobetasol ointment (TEMOVATE) 0.05 %; Apply 1 application topically 2 (two) times daily for 14 days. Thin layer on affected vulva twice a day for 2 weeks, then daily.  Counseling on above issues and coordination of care more than 50% for 25 minutes.  Princess Bruins MD, 12:49 PM 04/25/2017

## 2017-04-25 NOTE — Patient Instructions (Signed)
1. Subacute vulvitis Possible contact vulvitis, but more probably leak can sclerosis.  Avoid irritants.  May soak in warm water.  Avoid rubbing.  Will start on clobetasol ointment 1.61% thin application on the affected vulva twice a day for 2 weeks, then decrease to daily applications.  We will follow-up in 3 weeks to reassess the vulva and decide on long-term management as indicated.  2. Lichen sclerosus et atrophicus of the vulva Management as above.  Will decide if a vulvar biopsy is needed per exam at follow-up.  Other orders - clobetasol ointment (TEMOVATE) 0.05 %; Apply 1 application topically 2 (two) times daily for 14 days. Thin layer on affected vulva twice a day for 2 weeks, then daily.  Jeani Hawking, good seeing you today!   Lichen Sclerosus Lichen sclerosus is a skin problem. It can happen on any part of the body, but it commonly involves the anal or genital areas. It can cause itching and discomfort in these areas. Treatment can help to control symptoms. When the genital area is affected, getting treatment is important because the condition can cause scarring that may lead to other problems. What are the causes? The cause of this condition is not known. It could be the result of an overactive immune system or a lack of certain hormones. Lichen sclerosus is not an infection or a fungus. It is not passed from one person to another (not contagious). What increases the risk? This condition is more likely to develop in women, usually after menopause. What are the signs or symptoms? Symptoms of this condition include:  Thin, wrinkled, white areas on the skin.  Thickened white areas on the skin.  Red and swollen patches (lesions) on the skin.  Tears or cracks in the skin.  Bruising.  Blood blisters.  Severe itching.  You may also have pain, itching, or burning with urination. Constipation is also common in people with lichen sclerosus. How is this diagnosed? This condition may be  diagnosed with a physical exam. In some cases, a tissue sample (biopsy sample) may be removed to be looked at under a microscope. How is this treated? This condition is usually treated with medicated creams or ointments (topical steroids) that are applied over the affected areas. Follow these instructions at home:  Take over-the-counter and prescription medicines only as told by your health care provider.  Use creams or ointments as told by your health care provider.  Do not scratch the affected areas of skin.  Women should keep the vaginal area as clean and dry as possible.  Keep all follow-up visits as told by your health care provider. This is important. Contact a health care provider if:  You have increasing redness, swelling, or pain in the affected area.  You have fluid, blood, or pus coming from the affected area.  You have new lesions on your skin.  You have a fever.  You have pain during sex. This information is not intended to replace advice given to you by your health care provider. Make sure you discuss any questions you have with your health care provider. Document Released: 05/19/2010 Document Revised: 06/04/2015 Document Reviewed: 03/24/2014 Elsevier Interactive Patient Education  [employer].

## 2017-05-10 ENCOUNTER — Encounter (INDEPENDENT_AMBULATORY_CARE_PROVIDER_SITE_OTHER): Payer: Self-pay

## 2017-05-16 ENCOUNTER — Ambulatory Visit: Payer: BLUE CROSS/BLUE SHIELD | Admitting: Obstetrics & Gynecology

## 2017-05-17 ENCOUNTER — Ambulatory Visit (INDEPENDENT_AMBULATORY_CARE_PROVIDER_SITE_OTHER): Payer: BLUE CROSS/BLUE SHIELD | Admitting: Obstetrics & Gynecology

## 2017-05-17 ENCOUNTER — Encounter: Payer: Self-pay | Admitting: Obstetrics & Gynecology

## 2017-05-17 VITALS — BP 122/84

## 2017-05-17 DIAGNOSIS — N952 Postmenopausal atrophic vaginitis: Secondary | ICD-10-CM | POA: Diagnosis not present

## 2017-05-17 DIAGNOSIS — N904 Leukoplakia of vulva: Secondary | ICD-10-CM

## 2017-05-17 MED ORDER — CLOBETASOL PROPIONATE 0.05 % EX CREA: 1.0000 "application " | TOPICAL_CREAM | CUTANEOUS | 2 refills | Status: DC

## 2017-05-17 MED ORDER — ESTRADIOL 10 MCG VA TABS: 1.0000 | ORAL_TABLET | VAGINAL | 4 refills | Status: DC

## 2017-05-17 NOTE — Progress Notes (Signed)
    Cassandra Dickson [birth year] [id number]        58 y.o.  G1P0101 Remarried  RP: F/U Lichen Sclerosus on Clobetasol 0.05% cream  HPI: Diagnosed with lichen sclerosis at last visit on April 25, 2017.  Started on clobetasol cream 0.05% to use twice daily at first then decrease to once daily.  Excellent response to the treatment with decreased symptoms already after 2 days of treatment and now completely asymptomatic.  Patient has not been sexually active for a few months because of the severe irritation at the vulva.  Would like to resume sexual activity at this time but will need to resume Vagifem to do so.  History of left breast cancer diagnosed in 2006.   OB History  Gravida Para Term Preterm AB Living  1 1   1   1   SAB TAB Ectopic Multiple Live Births          1    # Outcome Date GA Lbr Len/2nd Weight Sex Delivery Anes PTL Lv  1 Preterm     M Vag-Spont  Y LIV    Past medical history,surgical history, problem list, medications, allergies, family history and social history were all reviewed and documented in the EPIC chart.   Directed ROS with pertinent positives and negatives documented in the history of present illness/assessment and plan.  Exam:  Vitals:   05/17/17 1408  BP: 122/84   General appearance:  Normal  Gynecologic exam: Vulva: Atrophy of the labia minora especially in the midportion and posteriorly.  Resolved acute vulvitis.  More whitening from lichen sclerosus anteriorly in the periclitoral area.   Assessment/Plan:  57 y.o. G1P0101   1. Lichen sclerosus et atrophicus of the vulva Lichen sclerosus responding to clobetasol cream treatment.  Much improved symptoms and appearance of the vulva on exam.  Patient will now progressively decreased the number of clobetasol cream applications from daily to eventually twice a week.  2. Post-menopausal atrophic vaginitis Atrophic vaginitis of menopause.  Would like to resume Vagifem which was helping in the past with dryness  and pain with intercourse.  History of breast cancer, but agreed with oncologist that the benefits of topical estrogen outweigh the risks at this point given minimal systemic exposure.  Vagifem 10 mcg tablet vaginally daily for 2 weeks then twice a week, prescription sent to pharmacy.  Other orders - Estradiol 10 MCG TABS vaginal tablet; Place 1 tablet (10 mcg total) vaginally 2 (two) times a week. Place 1 tab vaginally daily x 2 weeks, then twice a week. - clobetasol cream (TEMOVATE) 0.05 %; Apply 1 application topically 2 (two) times a week. Thin vulvar application.  Counseling on above issues and coordination of care more than 50% for 15 minutes.  Princess Bruins MD, 2:38 PM 05/17/2017

## 2017-05-19 ENCOUNTER — Encounter: Payer: Self-pay | Admitting: Obstetrics & Gynecology

## 2017-05-19 NOTE — Patient Instructions (Signed)
1. Lichen sclerosus et atrophicus of the vulva Lichen sclerosus responding to clobetasol cream treatment.  Much improved symptoms and appearance of the vulva on exam.  Patient will now progressively decreased the number of clobetasol cream applications from daily to eventually twice a week.  2. Post-menopausal atrophic vaginitis Atrophic vaginitis of menopause.  Would like to resume Vagifem which was helping in the past with dryness and pain with intercourse.  History of breast cancer, but agreed with oncologist that the benefits of topical estrogen outweigh the risks at this point given minimal systemic exposure.  Vagifem 10 mcg tablet vaginally daily for 2 weeks then twice a week, prescription sent to pharmacy.  Other orders - Estradiol 10 MCG TABS vaginal tablet; Place 1 tablet (10 mcg total) vaginally 2 (two) times a week. Place 1 tab vaginally daily x 2 weeks, then twice a week. - clobetasol cream (TEMOVATE) 0.05 %; Apply 1 application topically 2 (two) times a week. Thin vulvar application.  Jeani Hawking, good seeing you today!

## 2017-05-23 ENCOUNTER — Telehealth: Payer: Self-pay | Admitting: *Deleted

## 2017-05-23 MED ORDER — CLOBETASOL PROPIONATE 0.05 % EX OINT: 1.0000 "application " | TOPICAL_OINTMENT | Freq: Two times a day (BID) | CUTANEOUS | 0 refills | Status: DC

## 2017-05-23 NOTE — Telephone Encounter (Signed)
Rx sent to pharmacy   

## 2017-05-23 NOTE — Telephone Encounter (Signed)
Please send Clobetasol 0.05% ointment.

## 2017-05-23 NOTE — Telephone Encounter (Signed)
Medication for clobetasol cream 0.05% was denied by [facility] patient will need to try and fail preferred medication such as:  augmented betamethasone 0.05% ointment,  Clobetasol 0.05% ointment, halobetasol 0.05% ointment.  If of these medications an option? Please advise

## 2017-05-30 ENCOUNTER — Telehealth: Payer: Self-pay | Admitting: *Deleted

## 2017-05-30 MED ORDER — GABAPENTIN 300 MG PO CAPS: 300.0000 mg | ORAL_CAPSULE | Freq: Every day | ORAL | 3 refills | Status: DC

## 2017-05-30 NOTE — Telephone Encounter (Signed)
Agree with Gabapentin 300 mg/tab 1 tab per day as needed.  #30, refill x 3.

## 2017-05-30 NOTE — Telephone Encounter (Signed)
Patient called asking if you would be willing to refill gabapentin 300 mg tablet to take prn. Pt said Dr. Jana Hakim prescribed, for her to take rather than Advil. No longer has to see him, has been cleared from oncology.  Patient works out at [employer] and feels sore sometimes, history of breast cancer. Patient said she take 1 pill maybe once or twice a week if that. If you don't feel comfortable prescribing she understands.  Please advise

## 2017-05-30 NOTE — Telephone Encounter (Signed)
Left on voicemail Rx sent 

## 2017-09-13 ENCOUNTER — Other Ambulatory Visit: Payer: Self-pay | Admitting: Obstetrics & Gynecology

## 2017-09-13 DIAGNOSIS — Z1231 Encounter for screening mammogram for malignant neoplasm of breast: Secondary | ICD-10-CM

## 2017-09-19 ENCOUNTER — Other Ambulatory Visit: Payer: Self-pay | Admitting: Anesthesiology

## 2017-09-19 DIAGNOSIS — C50912 Malignant neoplasm of unspecified site of left female breast: Secondary | ICD-10-CM

## 2017-09-19 DIAGNOSIS — Z170 Estrogen receptor positive status [ER+]: Secondary | ICD-10-CM

## 2017-09-20 ENCOUNTER — Other Ambulatory Visit: Payer: Self-pay | Admitting: *Deleted

## 2017-09-20 DIAGNOSIS — Z86000 Personal history of in-situ neoplasm of breast: Secondary | ICD-10-CM

## 2017-10-09 ENCOUNTER — Ambulatory Visit
Admission: RE | Admit: 2017-10-09 | Discharge: 2017-10-09 | Disposition: A | Discharge status: Home / Self Care | Payer: BLUE CROSS/BLUE SHIELD | Source: Ambulatory Visit | Attending: Obstetrics & Gynecology | Admitting: Obstetrics & Gynecology

## 2017-10-09 DIAGNOSIS — Z1231 Encounter for screening mammogram for malignant neoplasm of breast: Secondary | ICD-10-CM | POA: Diagnosis not present

## 2017-10-24 ENCOUNTER — Ambulatory Visit (INDEPENDENT_AMBULATORY_CARE_PROVIDER_SITE_OTHER): Payer: BLUE CROSS/BLUE SHIELD

## 2017-10-24 ENCOUNTER — Telehealth: Payer: Self-pay | Admitting: *Deleted

## 2017-10-24 ENCOUNTER — Ambulatory Visit (INDEPENDENT_AMBULATORY_CARE_PROVIDER_SITE_OTHER): Payer: BLUE CROSS/BLUE SHIELD | Admitting: Obstetrics & Gynecology

## 2017-10-24 ENCOUNTER — Encounter: Payer: Self-pay | Admitting: Obstetrics & Gynecology

## 2017-10-24 VITALS — BP 115/72

## 2017-10-24 DIAGNOSIS — N952 Postmenopausal atrophic vaginitis: Secondary | ICD-10-CM

## 2017-10-24 DIAGNOSIS — C50912 Malignant neoplasm of unspecified site of left female breast: Secondary | ICD-10-CM

## 2017-10-24 DIAGNOSIS — Z23 Encounter for immunization: Secondary | ICD-10-CM

## 2017-10-24 DIAGNOSIS — Z170 Estrogen receptor positive status [ER+]: Secondary | ICD-10-CM

## 2017-10-24 DIAGNOSIS — Z86000 Personal history of in-situ neoplasm of breast: Secondary | ICD-10-CM | POA: Diagnosis not present

## 2017-10-24 MED ORDER — NONFORMULARY OR COMPOUNDED ITEM: 3 refills | Status: DC

## 2017-10-24 NOTE — Telephone Encounter (Signed)
-----   Message from Princess Bruins, MD sent at 10/24/2017 12:25 PM EDT ----- Regarding: Compound Estrogen cream Please send a prescription for Vaginal Compound Estrogen cream 1/4 of an applicator twice a week x 1 year.

## 2017-10-24 NOTE — Telephone Encounter (Signed)
Rx called in 

## 2017-10-24 NOTE — Progress Notes (Signed)
    SRITHA CHAUNCEY 1960-05-11 [id number]        57 y.o.  G1P1L1 Remarried  RP: Screening pelvic ultrasound for personal history of breast cancer  HPI: Left breast cancer diagnosed in 2006, on tamoxifen for 10 years.  BRCA 1 and 2 negative.  Postmenopausal on no hormone replacement therapy.  No postmenopausal bleeding.  No pelvic pain.  Complains of dryness and pain with intercourse in spite of lubricants.   OB History  Gravida Para Term Preterm AB Living  _0 SAB TAB Ectopic Multiple Live Births          1    # Outcome Date GA Lbr Len/2nd Weight Sex Delivery Anes PTL Lv  1 Preterm     M Vag-Spont  Y LIV    Past medical history,surgical history, problem list, medications, allergies, family history and social history were all reviewed and documented in the EPIC chart.   Directed ROS with pertinent positives and negatives documented in the history of present illness/assessment and plan.  Exam:  Vitals:   10/24/17 1154  BP: 115/72   General appearance:  Normal  Pelvic US today: T/V images.  Uterus anteverted measuring 6.45 x 4.27 x 2.24 cm.  Endometrial line seen in and normal at 3.3 mm.  Intramural fibroids measuring 1.0 x 0.8 cm.  Right and left ovaries normal.  No apparent mass in the right or left adnexa.  No free fluid in the posterior cul-de-sac.   Assessment/Plan:  58 y.o. G1P0101   1. Malignant neoplasm of left breast in female, estrogen receptor positive, unspecified site of breast ([place]) Left breast cancer diagnosed in 2006.  On tamoxifen for 10 years.  BRCA 1 and 2 negative.  Screening pelvic ultrasound today showing a normal uterus .with a small intramural fibroid measuring 1 cm. The endometrial line is thin and normal at 3.3 mm. Both ovaries are normal.  No adnexal mass and no free fluid in the posterior cul-de-sac.  2. Post-menopausal atrophic vaginitis Pain with intercourse associated with postmenopausal atrophic vaginitis and dryness.  Lubricants not  sufficient.  Decision to start on compound vaginal estrogen cream.  Will use a quarter of an applicator twice a week as needed.  Counseling on above issues and coordination of care more than 50% for 15 minutes.  Princess Bruins MD, 12:11 PM 10/24/2017

## 2017-10-29 ENCOUNTER — Encounter: Payer: Self-pay | Admitting: Obstetrics & Gynecology

## 2017-10-29 NOTE — Patient Instructions (Signed)
1. Malignant neoplasm of left breast in female, estrogen receptor positive, unspecified site of breast ([place]) Left breast cancer diagnosed in 2006.  On tamoxifen for 10 years.  BRCA 1 and 2 negative.  Screening pelvic ultrasound today showing a normal uterus .with a small intramural fibroid measuring 1 cm. The endometrial line is thin and normal at 3.3 mm. Both ovaries are normal.  No adnexal mass and no free fluid in the posterior cul-de-sac.  2. Post-menopausal atrophic vaginitis Pain with intercourse associated with postmenopausal atrophic vaginitis and dryness.  Lubricants not sufficient.  Decision to start on compound vaginal estrogen cream.  Will use a quarter of an applicator twice a week as needed.  Cassandra Dickson, it was a pleasure seeing you today!

## 2017-11-01 ENCOUNTER — Ambulatory Visit (INDEPENDENT_AMBULATORY_CARE_PROVIDER_SITE_OTHER): Payer: BLUE CROSS/BLUE SHIELD

## 2017-11-01 ENCOUNTER — Other Ambulatory Visit: Payer: Self-pay | Admitting: Obstetrics & Gynecology

## 2017-11-01 DIAGNOSIS — M8589 Other specified disorders of bone density and structure, multiple sites: Secondary | ICD-10-CM

## 2017-11-01 DIAGNOSIS — Z13820 Encounter for screening for osteoporosis: Secondary | ICD-10-CM

## 2017-12-13 ENCOUNTER — Other Ambulatory Visit: Payer: Self-pay | Admitting: Obstetrics & Gynecology

## 2017-12-14 NOTE — Telephone Encounter (Signed)
I should not be the one represcribing Neurontin.  Please ask patient to request refill from original prescriber.

## 2018-01-25 ENCOUNTER — Telehealth: Payer: Self-pay | Admitting: *Deleted

## 2018-01-25 NOTE — Telephone Encounter (Signed)
Patient using compound estradiol cream twice weekly states no improvement with vaginal dryness, which make sexual intercourse very painful, patient asked if you had any other suggestions? Please advise

## 2018-01-26 NOTE — Telephone Encounter (Signed)
Best results with Coconut Oil during IC.

## 2018-01-29 NOTE — Telephone Encounter (Signed)
Left detailed message on cell per DPR access. 

## 2018-02-09 ENCOUNTER — Other Ambulatory Visit: Payer: Self-pay | Admitting: *Deleted

## 2018-02-12 NOTE — Telephone Encounter (Signed)
Dr.Lavoie said you didn't answer her question below:  "Please ask Dr. Dellis Filbert about my question regarding Replens safety with using Estradiol inserts and the cream I am using for redness."

## 2018-03-29 ENCOUNTER — Ambulatory Visit (INDEPENDENT_AMBULATORY_CARE_PROVIDER_SITE_OTHER): Payer: BLUE CROSS/BLUE SHIELD | Admitting: Obstetrics & Gynecology

## 2018-03-29 ENCOUNTER — Other Ambulatory Visit: Payer: Self-pay

## 2018-03-29 ENCOUNTER — Encounter: Payer: Self-pay | Admitting: Obstetrics & Gynecology

## 2018-03-29 VITALS — BP 128/76 | Ht 64.0 in | Wt 155.0 lb

## 2018-03-29 DIAGNOSIS — M791 Myalgia, unspecified site: Secondary | ICD-10-CM

## 2018-03-29 DIAGNOSIS — L439 Lichen planus, unspecified: Secondary | ICD-10-CM

## 2018-03-29 DIAGNOSIS — Z780 Asymptomatic menopausal state: Secondary | ICD-10-CM | POA: Diagnosis not present

## 2018-03-29 DIAGNOSIS — Z01419 Encounter for gynecological examination (general) (routine) without abnormal findings: Secondary | ICD-10-CM

## 2018-03-29 DIAGNOSIS — M8589 Other specified disorders of bone density and structure, multiple sites: Secondary | ICD-10-CM | POA: Diagnosis not present

## 2018-03-29 MED ORDER — CLOBETASOL PROPIONATE 0.05 % EX OINT: 1.0000 "application " | TOPICAL_OINTMENT | CUTANEOUS | 4 refills | Status: DC

## 2018-03-29 MED ORDER — GABAPENTIN 300 MG PO CAPS: 300.0000 mg | ORAL_CAPSULE | Freq: Every day | ORAL | 4 refills | Status: DC | PRN

## 2018-03-29 NOTE — Progress Notes (Signed)
Cassandra Dickson 05/28/1960 [id number]   History:    58 y.o. G1P1L1 Remarried.  Husband has 3 children  RP:  Established patient presenting for annual gyn exam   HPI: Menopause, well on no hormone replacement therapy except for dryness with intercourse.  Using a lubricant and coconut oil recommended.  No postmenopausal bleeding.  No pelvic pain.  Urine and bowel movements normal.  Breast normal.  Body mass index 26.61.  Physically active.  We will follow-up here for fasting health labs.  Past medical history,surgical history, family history and social history were all reviewed and documented in the EPIC chart.  Gynecologic History Patient's last menstrual period was 03/21/2012. Contraception: post menopausal status Last Pap: 11/2016. Results were: Negative Last mammogram: 09/2017. Results were: Negative Bone Density: 10/2017 Osteopenia Colonoscopy: 05/2013  Obstetric History OB History  Gravida Para Term Preterm AB Living  1 1   1   1   SAB TAB Ectopic Multiple Live Births          1    # Outcome Date GA Lbr Len/2nd Weight Sex Delivery Anes PTL Lv  1 Preterm     M Vag-Spont  Y LIV     ROS: A ROS was performed and pertinent positives and negatives are included in the history.  GENERAL: No fevers or chills. HEENT: No change in vision, no earache, sore throat or sinus congestion. NECK: No pain or stiffness. CARDIOVASCULAR: No chest pain or pressure. No palpitations. PULMONARY: No shortness of breath, cough or wheeze. GASTROINTESTINAL: No abdominal pain, nausea, vomiting or diarrhea, melena or bright red blood per rectum. GENITOURINARY: No urinary frequency, urgency, hesitancy or dysuria. MUSCULOSKELETAL: No joint or muscle pain, no back pain, no recent trauma. DERMATOLOGIC: No rash, no itching, no lesions. ENDOCRINE: No polyuria, polydipsia, no heat or cold intolerance. No recent change in weight. HEMATOLOGICAL: No anemia or easy bruising or bleeding. NEUROLOGIC: No headache, seizures,  numbness, tingling or weakness. PSYCHIATRIC: No depression, no loss of interest in normal activity or change in sleep pattern.     Exam:   BP 128/76   Ht 5' 4"  (1.626 m)   Wt 155 lb (70.3 kg)   LMP 03/21/2012   BMI 26.61 kg/m   Body mass index is 26.61 kg/m.  General appearance : Well developed well nourished female. No acute distress HEENT: Eyes: no retinal hemorrhage or exudates,  Neck supple, trachea midline, no carotid bruits, no thyroidmegaly Lungs: Clear to auscultation, no rhonchi or wheezes, or rib retractions  Heart: Regular rate and rhythm, no murmurs or gallops Breast:Examined in sitting and supine position were symmetrical in appearance, no palpable masses or tenderness,  no skin retraction, no nipple inversion, no nipple discharge, no skin discoloration, no axillary or supraclavicular lymphadenopathy Abdomen: no palpable masses or tenderness, no rebound or guarding Extremities: no edema or skin discoloration or tenderness  Pelvic: Vulva: Normal             Vagina: No gross lesions or discharge  Cervix: No gross lesions or discharge.  Uterus  AV, normal size, shape and consistency, non-tender and mobile  Adnexa  Without masses or tenderness  Anus: Normal   Assessment/Plan:  58 y.o. female for annual exam   1. Well female exam with routine gynecological exam Normal gynecologic exam and menopause.  Pap test negative in November 2018, will repeat at 3 years.  Breast exam normal.  Screening mammogram September 2019 was negative.  Colonoscopy in 2015.  Health labs follow-up here fasting.  Good body mass index at 26.61.  Continue with fitness and healthy nutrition. - CBC; Future - Comp Met (CMET); Future - TSH; Future - Lipid panel; Future - VITAMIN D 25 Hydroxy (Vit-D Deficiency, Fractures); Future 2. Postmenopausal Well on no hormone replacement therapy.  We will continue with lubricants and coconut oil for dryness with intercourse.  Replens as needed.  3.  Osteopenia of multiple sites Bone density showing osteopenia at multiple sites in October 2019.  We will repeat a bone density in October 2021.  Vitamin D supplements, calcium intake total of 1.2 to 1.5 g/day and regular weightbearing physical activity is recommended.  4. Lichen planus atrophicus Improved symptoms on clobetasol ointment, but needs to apply on the entire small labia bilaterally.  Clobetasol ointment 0.05% represcribed.  5. Muscle pain Helped by Neurontin in the past.  Neurontin represcribed.  Other orders - gabapentin (NEURONTIN) 300 MG capsule; Take 1 capsule (300 mg total) by mouth daily as needed. - clobetasol ointment (TEMOVATE) 0.05 %; Apply 1 application topically 2 (two) times a week. Thin vulvar application on labia minora bilaterally.  Princess Bruins MD, 2:43 PM 03/29/2018

## 2018-04-01 ENCOUNTER — Encounter: Payer: Self-pay | Admitting: Obstetrics & Gynecology

## 2018-04-01 NOTE — Patient Instructions (Signed)
1. Well female exam with routine gynecological exam Normal gynecologic exam and menopause.  Pap test negative in November 2018, will repeat at 3 years.  Breast exam normal.  Screening mammogram September 2019 was negative.  Colonoscopy in 2015.  Health labs with family physician.  Good body mass index at 26.61.  Continue with fitness and healthy nutrition.  2. Postmenopausal Well on no hormone replacement therapy.  We will continue with lubricants and coconut oil for dryness with intercourse.  Replens as needed.  3. Osteopenia of multiple sites Bone density showing osteopenia at multiple sites in October 2019.  We will repeat a bone density in October 2021.  Vitamin D supplements, calcium intake total of 1.2 to 1.5 g/day and regular weightbearing physical activity is recommended.  4. Lichen planus atrophicus Improved symptoms on clobetasol ointment, but needs to apply on the entire small labia bilaterally.  Clobetasol ointment 0.05% represcribed.  5. Muscle pain Helped by Neurontin in the past.  Neurontin represcribed.  Other orders - gabapentin (NEURONTIN) 300 MG capsule; Take 1 capsule (300 mg total) by mouth daily as needed. - clobetasol ointment (TEMOVATE) 0.05 %; Apply 1 application topically 2 (two) times a week. Thin vulvar application on labia minora bilaterally.  Cassandra Dickson, it was a pleasure seeing you today!

## 2018-08-02 DIAGNOSIS — L821 Other seborrheic keratosis: Secondary | ICD-10-CM | POA: Diagnosis not present

## 2018-08-02 DIAGNOSIS — D2371 Other benign neoplasm of skin of right lower limb, including hip: Secondary | ICD-10-CM | POA: Diagnosis not present

## 2018-08-02 DIAGNOSIS — L570 Actinic keratosis: Secondary | ICD-10-CM | POA: Diagnosis not present

## 2018-08-02 DIAGNOSIS — L918 Other hypertrophic disorders of the skin: Secondary | ICD-10-CM | POA: Diagnosis not present

## 2018-08-02 DIAGNOSIS — D1801 Hemangioma of skin and subcutaneous tissue: Secondary | ICD-10-CM | POA: Diagnosis not present

## 2018-10-09 ENCOUNTER — Encounter: Payer: Self-pay | Admitting: Gynecology

## 2018-10-19 ENCOUNTER — Other Ambulatory Visit: Payer: Self-pay | Admitting: Obstetrics & Gynecology

## 2018-10-19 DIAGNOSIS — Z1231 Encounter for screening mammogram for malignant neoplasm of breast: Secondary | ICD-10-CM

## 2018-11-15 ENCOUNTER — Other Ambulatory Visit: Payer: Self-pay

## 2018-11-16 ENCOUNTER — Other Ambulatory Visit: Payer: Self-pay

## 2018-11-16 MED ORDER — NONFORMULARY OR COMPOUNDED ITEM: 4 refills | Status: DC

## 2018-11-27 ENCOUNTER — Other Ambulatory Visit: Payer: Self-pay

## 2018-11-27 ENCOUNTER — Ambulatory Visit
Admission: RE | Admit: 2018-11-27 | Discharge: 2018-11-27 | Disposition: A | Discharge status: Home / Self Care | Payer: BC Managed Care – PPO | Source: Ambulatory Visit | Attending: Obstetrics & Gynecology | Admitting: Obstetrics & Gynecology

## 2018-11-27 DIAGNOSIS — Z1231 Encounter for screening mammogram for malignant neoplasm of breast: Secondary | ICD-10-CM | POA: Diagnosis not present

## 2018-12-04 ENCOUNTER — Ambulatory Visit: Payer: BLUE CROSS/BLUE SHIELD

## 2019-01-07 ENCOUNTER — Other Ambulatory Visit: Payer: Self-pay

## 2019-01-09 ENCOUNTER — Ambulatory Visit: Payer: BLUE CROSS/BLUE SHIELD | Admitting: Obstetrics & Gynecology

## 2019-01-09 ENCOUNTER — Ambulatory Visit: Payer: Self-pay | Admitting: Women's Health

## 2019-01-16 ENCOUNTER — Ambulatory Visit: Payer: Self-pay | Admitting: Women's Health

## 2019-03-29 ENCOUNTER — Other Ambulatory Visit: Payer: Self-pay

## 2019-04-01 ENCOUNTER — Ambulatory Visit (INDEPENDENT_AMBULATORY_CARE_PROVIDER_SITE_OTHER): Payer: 59 | Admitting: Obstetrics & Gynecology

## 2019-04-01 ENCOUNTER — Other Ambulatory Visit: Payer: Self-pay

## 2019-04-01 ENCOUNTER — Encounter: Payer: Self-pay | Admitting: Obstetrics & Gynecology

## 2019-04-01 VITALS — BP 118/76 | Ht 64.0 in | Wt 153.0 lb

## 2019-04-01 DIAGNOSIS — N904 Leukoplakia of vulva: Secondary | ICD-10-CM

## 2019-04-01 DIAGNOSIS — Z01419 Encounter for gynecological examination (general) (routine) without abnormal findings: Secondary | ICD-10-CM | POA: Diagnosis not present

## 2019-04-01 DIAGNOSIS — Z780 Asymptomatic menopausal state: Secondary | ICD-10-CM | POA: Diagnosis not present

## 2019-04-01 DIAGNOSIS — M8589 Other specified disorders of bone density and structure, multiple sites: Secondary | ICD-10-CM

## 2019-04-01 DIAGNOSIS — Z170 Estrogen receptor positive status [ER+]: Secondary | ICD-10-CM

## 2019-04-01 DIAGNOSIS — Z853 Personal history of malignant neoplasm of breast: Secondary | ICD-10-CM

## 2019-04-01 DIAGNOSIS — C50912 Malignant neoplasm of unspecified site of left female breast: Secondary | ICD-10-CM

## 2019-04-01 MED ORDER — CLOBETASOL PROPIONATE 0.05 % EX OINT: 1.0000 "application " | TOPICAL_OINTMENT | CUTANEOUS | 4 refills | Status: DC

## 2019-04-01 NOTE — Progress Notes (Signed)
Cassandra Dickson March 30, 1960 [id number]   History:    59 y.o. G1P1L1 Remarried.  Husband has 3 children  RP:  Established patient presenting for annual gyn exam   HPI: Menopause, well on no hormone replacement therapy except for dryness with intercourse.  Using coconut oil successfully.  No postmenopausal bleeding.  No pelvic pain.  Urine and bowel movements normal. Breast normal. Body mass index 26.26.  Physically active.  We will follow-up here for fasting health labs.  Past medical history,surgical history, family history and social history were all reviewed and documented in the EPIC chart.  Gynecologic History Patient's last menstrual period was 03/21/2012.  Obstetric History OB History  Gravida Para Term Preterm AB Living  1 1   1   1   SAB TAB Ectopic Multiple Live Births          1    # Outcome Date GA Lbr Len/2nd Weight Sex Delivery Anes PTL Lv  1 Preterm     M Vag-Spont  Y LIV     ROS: A ROS was performed and pertinent positives and negatives are included in the history.  GENERAL: No fevers or chills. HEENT: No change in vision, no earache, sore throat or sinus congestion. NECK: No pain or stiffness. CARDIOVASCULAR: No chest pain or pressure. No palpitations. PULMONARY: No shortness of breath, cough or wheeze. GASTROINTESTINAL: No abdominal pain, nausea, vomiting or diarrhea, melena or bright red blood per rectum. GENITOURINARY: No urinary frequency, urgency, hesitancy or dysuria. MUSCULOSKELETAL: No joint or muscle pain, no back pain, no recent trauma. DERMATOLOGIC: No rash, no itching, no lesions. ENDOCRINE: No polyuria, polydipsia, no heat or cold intolerance. No recent change in weight. HEMATOLOGICAL: No anemia or easy bruising or bleeding. NEUROLOGIC: No headache, seizures, numbness, tingling or weakness. PSYCHIATRIC: No depression, no loss of interest in normal activity or change in sleep pattern.     Exam:   BP 118/76   Ht 5\' 4"  (1.626 m)   Wt 153 lb (69.4 kg)    LMP 03/21/2012   BMI 26.26 kg/m   Body mass index is 26.26 kg/m.  General appearance : Well developed well nourished female. No acute distress HEENT: Eyes: no retinal hemorrhage or exudates,  Neck supple, trachea midline, no carotid bruits, no thyroidmegaly Lungs: Clear to auscultation, no rhonchi or wheezes, or rib retractions  Heart: Regular rate and rhythm, no murmurs or gallops Breast:Examined in sitting and supine position were symmetrical in appearance, no palpable masses or tenderness,  no skin retraction, no nipple inversion, no nipple discharge, no skin discoloration, no axillary or supraclavicular lymphadenopathy Abdomen: no palpable masses or tenderness, no rebound or guarding Extremities: no edema or skin discoloration or tenderness  Pelvic: Vulva: Normal             Vagina: No gross lesions or discharge  Cervix: No gross lesions or discharge.  Pap reflex done.  Uterus  AV, normal size, shape and consistency, non-tender and mobile  Adnexa  Without masses or tenderness  Anus: Normal   Assessment/Plan:  59 y.o. female for annual exam   1. Encounter for routine gynecological examination with Papanicolaou smear of cervix Normal gynecologic exam.  Pap reflex done.  Breast exam normal.  Screening mammogram negative in November 2020.  Colonoscopy in 2015.  We will follow-up here for fasting health labs.  Recommend establishing with a family physician.  2. Postmenopausal Well on no hormone replacement therapy.  No postmenopausal bleeding.  3. Osteopenia of multiple sites Very  mild osteopenia on bone density October 2019 with a T score of -1.2 at the lumbar spine.  We will repeat a bone density at 3 years.  Continue with vitamin D supplements, a total of calcium intake at 1200 mg daily and regular weightbearing physical activities.  4. Lichen sclerosus atrophicus Improved symptoms on clobetasol ointment.  We will continue the same.  Prescription sent to pharmacy.  5.  Malignant neoplasm of left breast in female, estrogen receptor positive, unspecified site of breast ([place]) Followed by Dr. Jana Hakim.  Other orders - clobetasol ointment (TEMOVATE) 0.05 %; Apply 1 application topically 2 (two) times a week. Thin vulvar application on labia minora bilaterally.  Princess Bruins MD, 2:48 PM 04/01/2019

## 2019-04-02 ENCOUNTER — Encounter: Payer: Self-pay | Admitting: Obstetrics & Gynecology

## 2019-04-02 LAB — PAP IG W/ RFLX HPV ASCU

## 2019-04-02 NOTE — Patient Instructions (Signed)
1. Encounter for routine gynecological examination with Papanicolaou smear of cervix Normal gynecologic exam.  Pap reflex done.  Breast exam normal.  Screening mammogram negative in November 2020.  Colonoscopy in 2015.  We will follow-up here for fasting health labs.  Recommend establishing with a family physician.  2. Postmenopausal Well on no hormone replacement therapy.  No postmenopausal bleeding.  3. Osteopenia of multiple sites Very mild osteopenia on bone density October 2019 with a T score of -1.2 at the lumbar spine.  We will repeat a bone density at 3 years.  Continue with vitamin D supplements, a total of calcium intake at 1200 mg daily and regular weightbearing physical activities.  4. Lichen sclerosus atrophicus Improved symptoms on clobetasol ointment.  We will continue the same.  Prescription sent to pharmacy.  5. Malignant neoplasm of left breast in female, estrogen receptor positive, unspecified site of breast ([place]) Followed by Dr. Jana Hakim.  Other orders - clobetasol ointment (TEMOVATE) 0.05 %; Apply 1 application topically 2 (two) times a week. Thin vulvar application on labia minora bilaterally.  Christiyana, it was a pleasure seeing you today!  I will inform you of your results as soon as they are available.

## 2019-04-03 ENCOUNTER — Encounter: Payer: Self-pay | Admitting: *Deleted

## 2019-04-17 ENCOUNTER — Other Ambulatory Visit: Payer: Self-pay

## 2019-04-18 ENCOUNTER — Encounter: Payer: Self-pay | Admitting: Obstetrics & Gynecology

## 2019-04-18 ENCOUNTER — Ambulatory Visit (INDEPENDENT_AMBULATORY_CARE_PROVIDER_SITE_OTHER): Payer: 59

## 2019-04-18 ENCOUNTER — Ambulatory Visit (INDEPENDENT_AMBULATORY_CARE_PROVIDER_SITE_OTHER): Payer: 59 | Admitting: Obstetrics & Gynecology

## 2019-04-18 VITALS — BP 120/78

## 2019-04-18 DIAGNOSIS — C50912 Malignant neoplasm of unspecified site of left female breast: Secondary | ICD-10-CM

## 2019-04-18 DIAGNOSIS — Z853 Personal history of malignant neoplasm of breast: Secondary | ICD-10-CM

## 2019-04-18 DIAGNOSIS — Z01419 Encounter for gynecological examination (general) (routine) without abnormal findings: Secondary | ICD-10-CM

## 2019-04-18 DIAGNOSIS — Z170 Estrogen receptor positive status [ER+]: Secondary | ICD-10-CM

## 2019-04-18 NOTE — Progress Notes (Signed)
    Cassandra Dickson 06-26-1960 [id number]        59 y.o.  G1P0101   RP: H/O Breast Cancer for Pelvic US  HPI: Postmenopause, well on no HRT.  No PMB.  No pelvic pain.     OB History  Gravida Para Term Preterm AB Living  _0 SAB TAB Ectopic Multiple Live Births          1    # Outcome Date GA Lbr Len/2nd Weight Sex Delivery Anes PTL Lv  1 Preterm     M Vag-Spont  Y LIV    Past medical history,surgical history, problem list, medications, allergies, family history and social history were all reviewed and documented in the EPIC chart.   Directed ROS with pertinent positives and negatives documented in the history of present illness/assessment and plan.  Exam:  Vitals:   04/18/19 1008  BP: 120/78   General appearance:  Normal  Pelvic US today: T/V images.  Small, anteverted uterus, normal size and shape with no myometrial mass.  The uterus is measured at 6.2 x 3.49 x 2.77 cm.  Thin and symmetrical endometrial line measured at 2.28 mm.  No endometrial mass or thickening seen.  Both ovaries are small with atrophic appearance.  No adnexal mass.  No free fluid in the posterior cul-de-sac.   Assessment/Plan:  59 y.o. G1P0101   1. Malignant neoplasm of left breast in female, estrogen receptor positive, unspecified site of breast [facility]) Personal history of left breast cancer for screening pelvic ultrasound to rule out ovarian pathology.  Pelvic ultrasound findings thoroughly reviewed with patient.  Uterus is normal with a thin endometrial lining at 2.28 mm.  Both ovaries are small with atrophic appearance.  No adnexal mass.  No free fluid in the posterior cul-de-sac.  Patient reassured.  2. Well woman exam Fasting health labs to complete patient's annual gynecologic visit. - CBC - Comp Met (CMET) - Lipid panel - VITAMIN D 25 Hydroxy (Vit-D Deficiency, Fractures) - TSH  Princess Bruins MD, 10:14 AM 04/18/2019

## 2019-04-19 LAB — COMPREHENSIVE METABOLIC PANEL
AG Ratio: 1.6 (calc) (ref 1.0–2.5)
ALT: 11 U/L (ref 6–29)
AST: 23 U/L (ref 10–35)
Albumin: 4.4 g/dL (ref 3.6–5.1)
Alkaline phosphatase (APISO): 62 U/L (ref 37–153)
BUN: 18 mg/dL (ref 7–25)
CO2: 26 mmol/L (ref 20–32)
Calcium: 9.3 mg/dL (ref 8.6–10.4)
Chloride: 103 mmol/L (ref 98–110)
Creat: 0.9 mg/dL (ref 0.50–1.05)
Globulin: 2.8 g/dL (calc) (ref 1.9–3.7)
Glucose, Bld: 100 mg/dL — ABNORMAL HIGH (ref 65–99)
Potassium: 4.7 mmol/L (ref 3.5–5.3)
Sodium: 137 mmol/L (ref 135–146)
Total Bilirubin: 0.6 mg/dL (ref 0.2–1.2)
Total Protein: 7.2 g/dL (ref 6.1–8.1)

## 2019-04-19 LAB — LIPID PANEL
Cholesterol: 171 mg/dL (ref <200)
HDL: 62 mg/dL (ref >=50)
LDL Cholesterol (Calc): 96 mg/dL (calc)
Non-HDL Cholesterol (Calc): 109 mg/dL (calc) (ref <130)
Total CHOL/HDL Ratio: 2.8 (calc) (ref <5.0)
Triglycerides: 44 mg/dL (ref <150)

## 2019-04-19 LAB — VITAMIN D 25 HYDROXY (VIT D DEFICIENCY, FRACTURES): Vit D, 25-Hydroxy: 23 ng/mL — ABNORMAL LOW (ref 30–100)

## 2019-04-19 LAB — CBC
HCT: 40.1 % (ref 35.0–45.0)
Hemoglobin: 12.8 g/dL (ref 11.7–15.5)
MCH: 28 pg (ref 27.0–33.0)
MCHC: 31.9 g/dL — ABNORMAL LOW (ref 32.0–36.0)
MCV: 87.7 fL (ref 80.0–100.0)
MPV: 10.8 fL (ref 7.5–12.5)
Platelets: 281 10*3/uL (ref 140–400)
RBC: 4.57 10*6/uL (ref 3.80–5.10)
RDW: 12.6 % (ref 11.0–15.0)
WBC: 5 10*3/uL (ref 3.8–10.8)

## 2019-04-19 LAB — TSH: TSH: 2.73 mIU/L (ref 0.40–4.50)

## 2019-04-24 ENCOUNTER — Encounter: Payer: Self-pay | Admitting: Obstetrics & Gynecology

## 2019-04-24 NOTE — Patient Instructions (Signed)
1. Malignant neoplasm of left breast in female, estrogen receptor positive, unspecified site of breast [facility]) Personal history of left breast cancer for screening pelvic ultrasound to rule out ovarian pathology.  Pelvic ultrasound findings thoroughly reviewed with patient.  Uterus is normal with a thin endometrial lining at 2.28 mm.  Both ovaries are small with atrophic appearance.  No adnexal mass.  No free fluid in the posterior cul-de-sac.  Patient reassured.  2. Well woman exam Fasting health labs to complete patient's annual gynecologic visit. - CBC - Comp Met (CMET) - Lipid panel - VITAMIN D 25 Hydroxy (Vit-D Deficiency, Fractures) - TSH  Jeani Hawking, it was a pleasure seeing you today!  I will inform you of your results as soon as they are available.

## 2019-04-25 NOTE — Telephone Encounter (Signed)
Called patient and spoke with her. Read her result note. She declined HgbA1c and will watch the simple sugars in her diet.

## 2019-04-25 NOTE — Telephone Encounter (Signed)
Left message to call on 04/24/19.

## 2019-07-18 ENCOUNTER — Other Ambulatory Visit: Payer: Self-pay | Admitting: Obstetrics & Gynecology

## 2019-08-22 ENCOUNTER — Encounter: Payer: Self-pay | Admitting: Genetic Counselor

## 2019-12-03 ENCOUNTER — Other Ambulatory Visit: Payer: Self-pay | Admitting: *Deleted

## 2019-12-03 MED ORDER — NONFORMULARY OR COMPOUNDED ITEM: 1 refills | Status: DC

## 2019-12-03 NOTE — Telephone Encounter (Signed)
Patient has annual exam in March 2021, okay to refill compound estradiol vaginal cream

## 2019-12-03 NOTE — Telephone Encounter (Signed)
Refill called in. 

## 2019-12-03 NOTE — Telephone Encounter (Signed)
Agree to refill compound Estradiol cream until her Annual/Gyn exam.

## 2020-01-07 ENCOUNTER — Other Ambulatory Visit: Payer: Self-pay | Admitting: Obstetrics & Gynecology

## 2020-01-08 NOTE — Telephone Encounter (Signed)
Per note on 03/29/18 ". Muscle pain Helped by Neurontin in the past.  Neurontin represcribed"

## 2020-01-16 ENCOUNTER — Other Ambulatory Visit: Payer: Self-pay | Admitting: Obstetrics & Gynecology

## 2020-01-16 DIAGNOSIS — Z1231 Encounter for screening mammogram for malignant neoplasm of breast: Secondary | ICD-10-CM

## 2020-01-17 IMAGING — MG DIGITAL SCREENING BILATERAL MAMMOGRAM WITH TOMO AND CAD
8 series · 9 of 24 positions shown · non-contrast
Comparison: Previous exam(s).

CLINICAL DATA: Screening.

EXAM:
DIGITAL SCREENING BILATERAL MAMMOGRAM WITH TOMO AND CAD

[L MLO synth-2D]
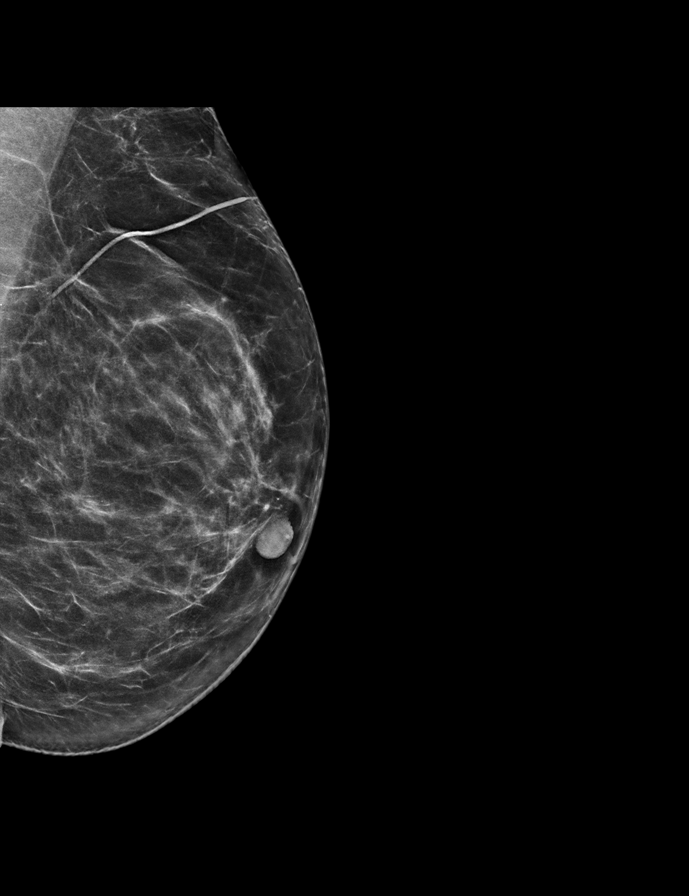

[R CC synth-2D]
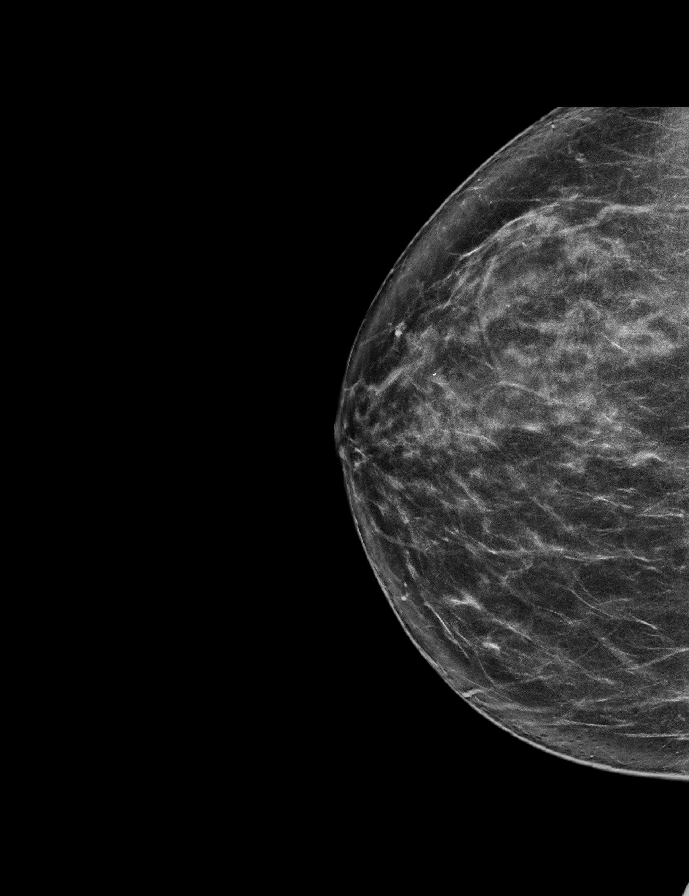

[R MLO synth-2D]
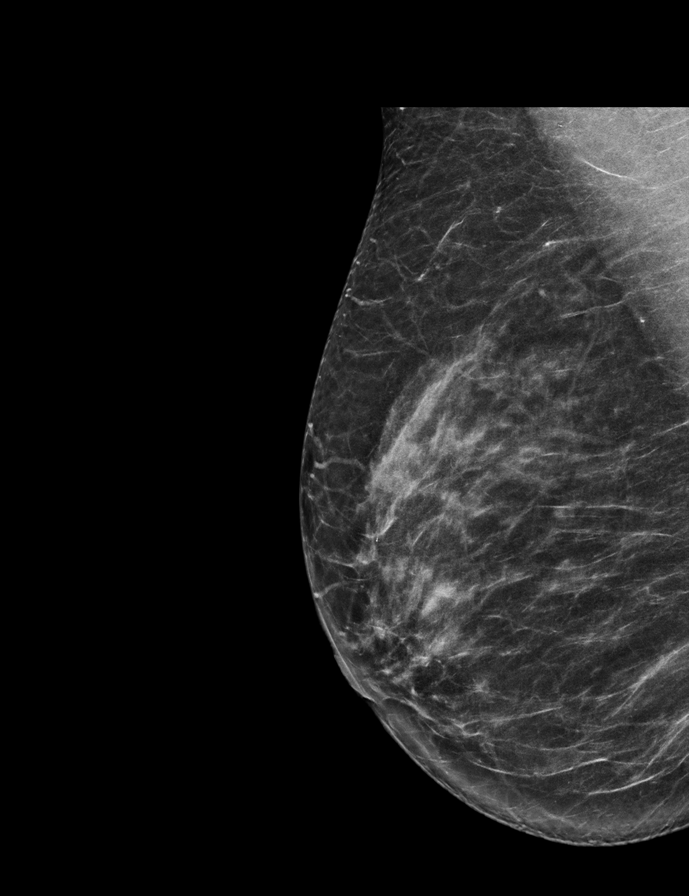

[L CC synth-2D]
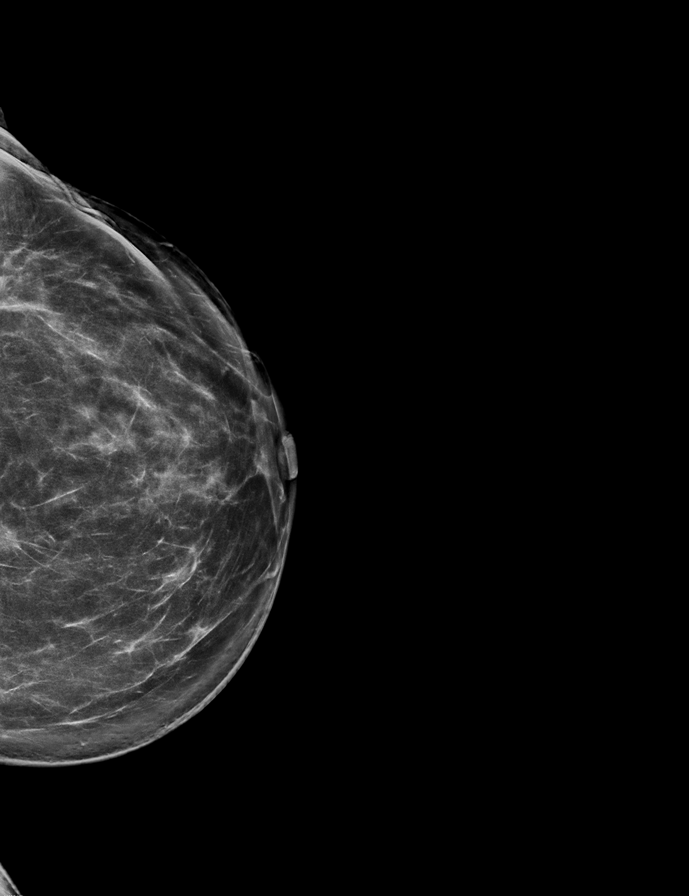

[R CC tomo · 2 of 74 frames shown]
[frame 24/74]
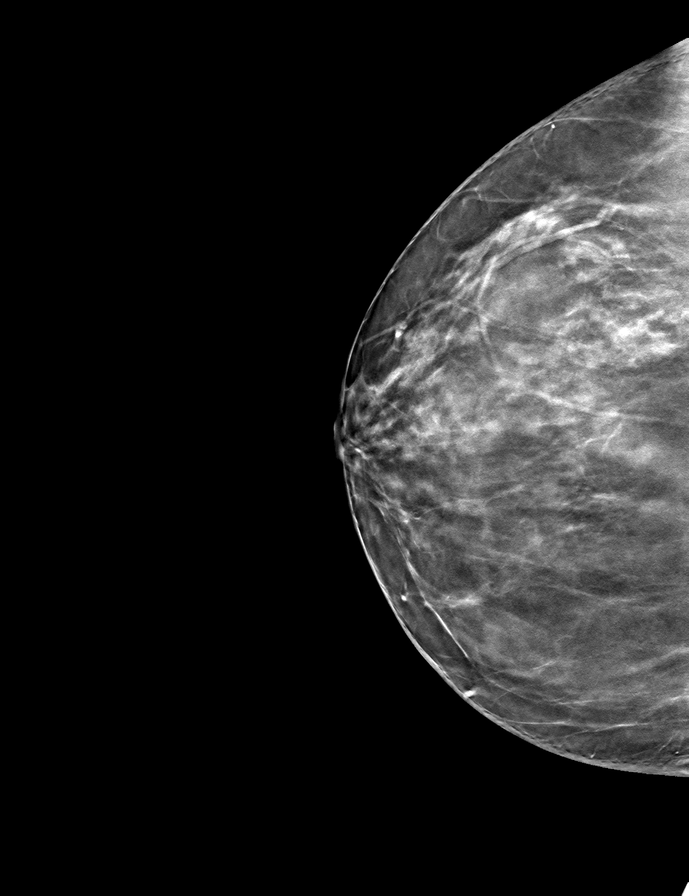
[frame 37/74]
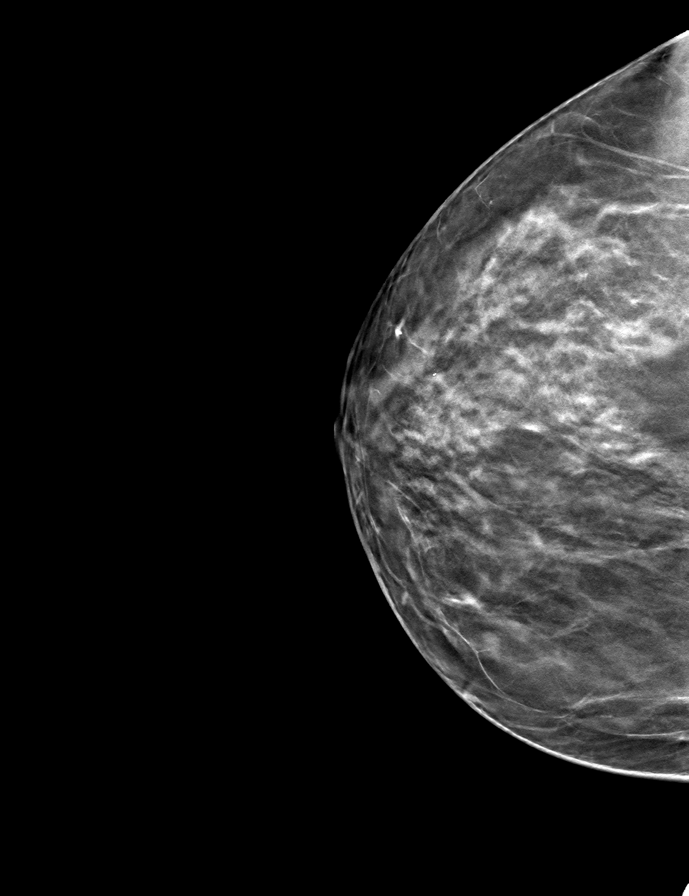

[L MLO tomo · tomo slice 35/69.0]
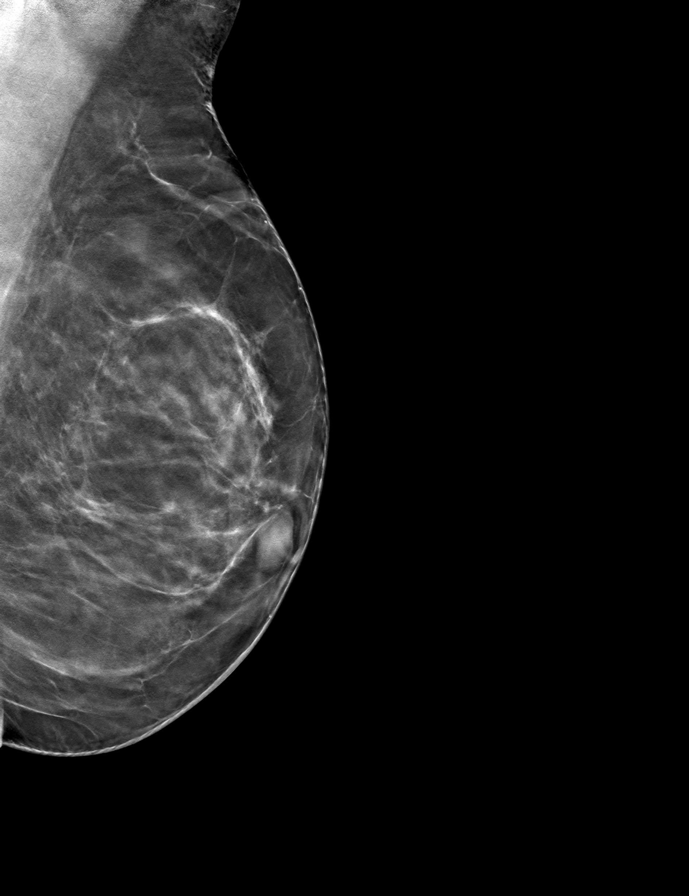

[R MLO tomo · tomo slice 36/71.0]
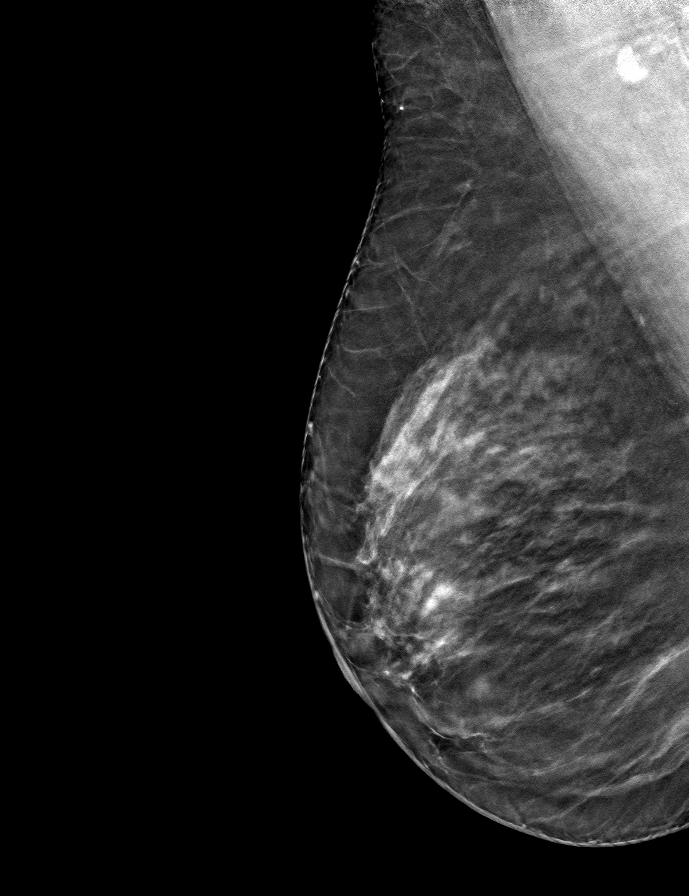

[L CC tomo · tomo slice 39/78.0]
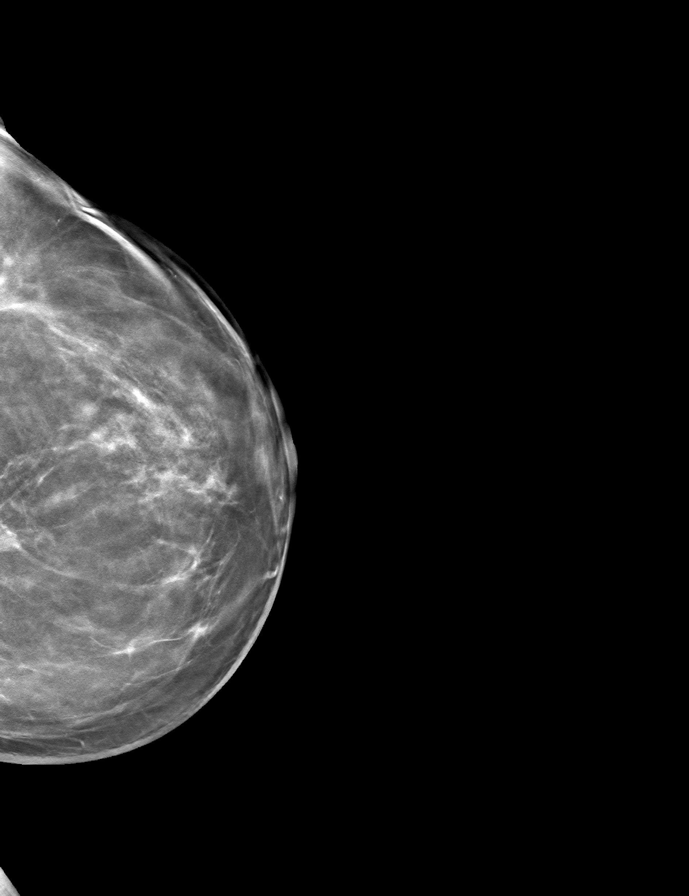

[9 of 24 positions shown; findings below may reference images not displayed]

ACR Breast Density Category c: The breast tissue is heterogeneously
dense, which may obscure small masses.
FINDINGS: There are no findings suspicious for malignancy. Images were
processed with CAD.
IMPRESSION: No mammographic evidence of malignancy. A result letter of this
screening mammogram will be mailed directly to the patient.

RECOMMENDATION:
Screening mammogram in one year. (Code:FT-U-LHB)

BI-RADS CATEGORY  1: Negative.

## 2020-02-25 ENCOUNTER — Other Ambulatory Visit: Payer: Self-pay

## 2020-02-25 ENCOUNTER — Ambulatory Visit
Admission: RE | Admit: 2020-02-25 | Discharge: 2020-02-25 | Disposition: A | Discharge status: Home / Self Care | Payer: 59 | Source: Ambulatory Visit | Attending: Obstetrics & Gynecology | Admitting: Obstetrics & Gynecology

## 2020-02-25 ENCOUNTER — Ambulatory Visit: Payer: BC Managed Care – PPO

## 2020-02-25 DIAGNOSIS — Z1231 Encounter for screening mammogram for malignant neoplasm of breast: Secondary | ICD-10-CM

## 2020-04-01 ENCOUNTER — Ambulatory Visit (INDEPENDENT_AMBULATORY_CARE_PROVIDER_SITE_OTHER): Payer: 59 | Admitting: Obstetrics & Gynecology

## 2020-04-01 ENCOUNTER — Other Ambulatory Visit: Payer: Self-pay

## 2020-04-01 ENCOUNTER — Encounter: Payer: Self-pay | Admitting: Obstetrics & Gynecology

## 2020-04-01 VITALS — BP 136/80 | Ht 63.5 in | Wt 149.0 lb

## 2020-04-01 DIAGNOSIS — Z01419 Encounter for gynecological examination (general) (routine) without abnormal findings: Secondary | ICD-10-CM

## 2020-04-01 DIAGNOSIS — C50912 Malignant neoplasm of unspecified site of left female breast: Secondary | ICD-10-CM

## 2020-04-01 DIAGNOSIS — N904 Leukoplakia of vulva: Secondary | ICD-10-CM | POA: Diagnosis not present

## 2020-04-01 DIAGNOSIS — M8589 Other specified disorders of bone density and structure, multiple sites: Secondary | ICD-10-CM

## 2020-04-01 DIAGNOSIS — Z780 Asymptomatic menopausal state: Secondary | ICD-10-CM

## 2020-04-01 DIAGNOSIS — Z170 Estrogen receptor positive status [ER+]: Secondary | ICD-10-CM

## 2020-04-01 MED ORDER — CLOBETASOL PROPIONATE 0.05 % EX OINT: 1.0000 "application " | TOPICAL_OINTMENT | Freq: Every day | CUTANEOUS | 4 refills | Status: DC

## 2020-04-01 NOTE — Progress Notes (Signed)
Cassandra Dickson May 16, 1960 [id number]   History:    60 y.o. G1P1L1 Remarried. Husband has 3 children  [id number] presenting for annual gyn exam   OVF:[id number], well on no systemic hormone replacement therapy. No postmenopausal bleeding.  Compound Estradiol cream twice a week.  Lichen sclerosus of the vulva on Clobetasol ointment twice a week, applying it mainly at the perineum.  Very very painful IC with red and irritated vulva. No pelvic pain. Urine and bowel movements normal. H/O Lt Breast Ca 15 yrs ago.  Breast normal. Body mass index 25.98. Physically active. Established with a Fam PA.  Past medical history,surgical history, family history and social history were all reviewed and documented in the EPIC chart.  Gynecologic History Patient's last menstrual period was 03/21/2012.  Obstetric History OB History  Gravida Para Term Preterm AB Living  1 1   1   1   SAB IAB Ectopic Multiple Live Births          1    # Outcome Date GA Lbr Len/2nd Weight Sex Delivery Anes PTL Lv  1 Preterm     M Vag-Spont  Y LIV     ROS: A ROS was performed and pertinent positives and negatives are included in the history.  GENERAL: No fevers or chills. HEENT: No change in vision, no earache, sore throat or sinus congestion. NECK: No pain or stiffness. CARDIOVASCULAR: No chest pain or pressure. No palpitations. PULMONARY: No shortness of breath, cough or wheeze. GASTROINTESTINAL: No abdominal pain, nausea, vomiting or diarrhea, melena or bright red blood per rectum. GENITOURINARY: No urinary frequency, urgency, hesitancy or dysuria. MUSCULOSKELETAL: No joint or muscle pain, no back pain, no recent trauma. DERMATOLOGIC: No rash, no itching, no lesions. ENDOCRINE: No polyuria, polydipsia, no heat or cold intolerance. No recent change in weight. HEMATOLOGICAL: No anemia or easy bruising or bleeding. NEUROLOGIC: No headache, seizures, numbness, tingling or weakness. PSYCHIATRIC: No  depression, no loss of interest in normal activity or change in sleep pattern.     Exam:   BP 136/80   Ht 5' 3.5" (1.613 m)   Wt 149 lb (67.6 kg)   LMP 03/21/2012   BMI 25.98 kg/m   Body mass index is 25.98 kg/m.  General appearance : Well developed well nourished female. No acute distress HEENT: Eyes: no retinal hemorrhage or exudates,  Neck supple, trachea midline, no carotid bruits, no thyroidmegaly Lungs: Clear to auscultation, no rhonchi or wheezes, or rib retractions  Heart: Regular rate and rhythm, no murmurs or gallops Breast:Examined in sitting and supine position were symmetrical in appearance, no palpable masses or tenderness,  no skin retraction, no nipple inversion, no nipple discharge, no skin discoloration, no axillary or supraclavicular lymphadenopathy Abdomen: no palpable masses or tenderness, no rebound or guarding Extremities: no edema or skin discoloration or tenderness  Pelvic: Vulva: Normal             Vagina: No gross lesions or discharge  Cervix: No gross lesions or discharge  Uterus  AV, normal size, shape and consistency, non-tender and mobile  Adnexa  Without masses or tenderness  Anus: Normal   Assessment/Plan:  60 y.o. female for annual exam   1. Well female exam with routine gynecological exam Gynecologic exam with severe vulvitis probably associated with under treated Lichen sclerosus of the vulva.  Pap Normal 03/2019, no indication to repeat this year.  [place] 2015.  Health labs with [facility].  BMI 25.98.  Healthy nutrition and good fitness.  2. Postmenopausal Well on no systemic HRT.  H/O Breast Ca 15 yrs ago.  No PMB.  Postmenopausal Atrophic Vaginitis on compound estradiol cream.  No CI to continue.  Will call pharmacy for a refill.  3. Lichen sclerosus et atrophicus of the vulva Severe Lichen Sclerosus of the vulva with disappearance of the left labia minora with white atrophy and severe erythema of the left labia minora and majora.   Recommendations made as to how and where to apply the Clobetasol ointment and will increase to everyday applications x 1 month.  F/U in 1 month to reassess or earlier if worsening.  Prescription sent to pharmacy.  4. Osteopenia of multiple sites Very mild Osteopenia on BD 10/2017, will repeat at 3 yrs 10/2020.  Vit D, Ca++ 1.5 g/d total and regular weightbearing physical activities. - DG Bone Density; Future  5. Malignant neoplasm of left breast in female, estrogen receptor positive, unspecified site of breast ([place]) Left Breast Ca 15 yrs ago.  Will do annual screening Pelvic US. - US Transvaginal Non-OB; Future  Other orders - clobetasol ointment (TEMOVATE) 0.05 %; Apply 1 application topically daily. Thin vulvar application on labia minora bilaterally.  Princess Bruins MD, 2:43 PM 04/01/2020

## 2020-04-02 ENCOUNTER — Telehealth: Payer: Self-pay | Admitting: *Deleted

## 2020-04-02 MED ORDER — NONFORMULARY OR COMPOUNDED ITEM: 3 refills | Status: DC

## 2020-04-02 NOTE — Telephone Encounter (Signed)
-----   Message from Princess Bruins, MD sent at 04/01/2020  3:19 PM EDT ----- Regarding: Compound estradiol cream to represcribe Please send prescription.

## 2020-04-02 NOTE — Telephone Encounter (Signed)
Rx called in 

## 2020-04-27 ENCOUNTER — Ambulatory Visit (INDEPENDENT_AMBULATORY_CARE_PROVIDER_SITE_OTHER): Payer: 59 | Admitting: Nurse Practitioner

## 2020-04-27 ENCOUNTER — Encounter: Payer: Self-pay | Admitting: Nurse Practitioner

## 2020-04-27 ENCOUNTER — Other Ambulatory Visit: Payer: Self-pay

## 2020-04-27 VITALS — BP 110/70 | HR 80 | Resp 16 | Wt 148.0 lb

## 2020-04-27 DIAGNOSIS — L259 Unspecified contact dermatitis, unspecified cause: Secondary | ICD-10-CM | POA: Diagnosis not present

## 2020-04-27 DIAGNOSIS — N9089 Other specified noninflammatory disorders of vulva and perineum: Secondary | ICD-10-CM | POA: Diagnosis not present

## 2020-04-27 LAB — WET PREP FOR TRICH, YEAST, CLUE

## 2020-04-27 NOTE — Progress Notes (Signed)
GYNECOLOGY  VISIT  CC:   Vaginal irritation  HPI: 60 y.o. G12P0101 Married White or Caucasian female here for vaginal rash.   Came to office for annual exam 04/01/2020 Was told to increased clobetasol for lichen sclerosis, at first it seemed to be helping that but now feels like she has swelling and irritation and open sores. Initially she felt irritated but now is much worse.  GYNECOLOGIC HISTORY: Patient's last menstrual period was 03/21/2012. Contraception: vasectomy Menopausal hormone therapy: estradiol vaginal cream but not in 2 weeks  Patient Active Problem List   Diagnosis Date Noted  . Hematuria of undiagnosed cause 11/18/2015  . Breast cancer, left breast ([place]) 04/24/2014    Past Medical History:  Diagnosis Date  . BRCA1 negative   . BRCA2 negative   . Cancer ([place]) 06/2004   LEFT BREAST CANCER.Marland Kitchen RADIATION / CHEMO  . NSVD (normal spontaneous vaginal delivery)   . Personal history of chemotherapy   . Personal history of radiation therapy     Past Surgical History:  Procedure Laterality Date  . BREAST LUMPECTOMY Left   . BREAST SURGERY  2006   LEFT LUMPECTOMY  . COLONOSCOPY  06/2007   BENIGN CECAL POLYP     MEDS:   Current Outpatient Medications on File Prior to Visit  Medication Sig Dispense Refill  . Biotin 5 MG CAPS Take 1 capsule by mouth.  0  . cholecalciferol (VITAMIN D) 1000 UNITS tablet Take 1,000 Units by mouth daily.    . clobetasol ointment (TEMOVATE) 5.91 % Apply 1 application topically daily. Thin vulvar application on labia minora bilaterally. 30 g 4  . Cyanocobalamin (B-12 PO) Take by mouth.    . gabapentin (NEURONTIN) 300 MG capsule TAKE 1 CAPSULE (300 MG TOTAL) BY MOUTH DAILY AS NEEDED. 30 capsule 0  . Melatonin 1 MG CAPS Take by mouth.    . NONFORMULARY OR COMPOUNDED ITEM Estradiol vaginal cream 0.02% cream Insert applicator intravaginally twice weekly. (Patient not taking: Reported on 04/27/2020) 90 each 3   No current facility-administered  medications on file prior to visit.    ALLERGIES: Patient has no known allergies.  Family History  Problem Relation Age of Onset  . Cancer Father        LUNG  . Hypertension Maternal Aunt   . Diabetes Maternal Uncle   . Breast cancer Maternal Grandmother 80  . Breast cancer Maternal Aunt 52  . Pancreatic cancer Maternal Aunt      Review of Systems  Constitutional: Negative.   HENT: Negative.   Eyes: Negative.   Respiratory: Negative.   Cardiovascular: Negative.   Gastrointestinal: Negative.   Endocrine: Negative.   Genitourinary:       Vaginal redness & irritation  Musculoskeletal: Negative.   Skin: Negative.   Allergic/Immunologic: Negative.   Neurological: Negative.   Hematological: Negative.   Psychiatric/Behavioral: Negative.     PHYSICAL EXAMINATION:    BP 110/70   Pulse 80   Resp 16   Wt 148 lb (67.1 kg)   LMP 03/21/2012   BMI 25.81 kg/m     General appearance: alert, cooperative, no acute distress  Physical Exam Genitourinary:          Lymph:  no inguinal LAD noted  Pelvic: External genitalia:  See diagraph above. Has redness, oozing sores, appears as a severe contact dermatitis              Urethra:  normal appearing urethra with no masses, tenderness or lesions   No  yeast detected on wet mount  Chaperone, Joy, CMA, was present for exam.  Assessment: Vulvar irritation - Plan: WET PREP FOR TRICH, YEAST, CLUE  Contact dermatitis, unspecified contact dermatitis type, unspecified trigger    Plan: Suspect possible allergic reaction to possible clobetasol or compounded estrogen. Encouraged patient to discontinue all products, use zinc oxide (may mix with antibiotic cream if desires)  Follow up 1 week  If improving, will discuss alternative regimens for management of vaginal atrophy and lichen sclerosis.   20 minutes of total time was spent for this patient encounter, including preparation, face-to-face counseling with the patient and  coordination of care, and documentation of the encounter.

## 2020-04-28 NOTE — Progress Notes (Signed)
GYNECOLOGY  VISIT  CC:  Follow up on contact dermatitis of vulva  HPI: 60 y.o. G48P0101 Married White or Caucasian female here for follow up of vulvar irritation.     GYNECOLOGIC HISTORY: Patient's last menstrual period was 03/21/2012. Contraception: vasectomy Menopausal hormone therapy: Estradiol vaginal cream  Patient Active Problem List   Diagnosis Date Noted  . Hematuria of undiagnosed cause 11/18/2015  . Breast cancer, left breast ([place]) 04/24/2014    Past Medical History:  Diagnosis Date  . BRCA1 negative   . BRCA2 negative   . Cancer ([place]) 06/2004   LEFT BREAST CANCER.Marland Kitchen RADIATION / CHEMO  . NSVD (normal spontaneous vaginal delivery)   . Personal history of chemotherapy   . Personal history of radiation therapy     Past Surgical History:  Procedure Laterality Date  . BREAST LUMPECTOMY Left   . BREAST SURGERY  2006   LEFT LUMPECTOMY  . COLONOSCOPY  06/2007   BENIGN CECAL POLYP     MEDS:   Current Outpatient Medications on File Prior to Visit  Medication Sig Dispense Refill  . Biotin 5 MG CAPS Take 1 capsule by mouth.  0  . cholecalciferol (VITAMIN D) 1000 UNITS tablet Take 1,000 Units by mouth daily.    . Cyanocobalamin (B-12 PO) Take by mouth.    . gabapentin (NEURONTIN) 300 MG capsule TAKE 1 CAPSULE (300 MG TOTAL) BY MOUTH DAILY AS NEEDED. 30 capsule 0  . Melatonin 1 MG CAPS Take by mouth.    . NONFORMULARY OR COMPOUNDED ITEM Estradiol vaginal cream 0.02% cream Insert applicator intravaginally twice weekly. (Patient not taking: No sig reported) 90 each 3   No current facility-administered medications on file prior to visit.    ALLERGIES: Patient has no known allergies.  Family History  Problem Relation Age of Onset  . Cancer Father        LUNG  . Hypertension Maternal Aunt   . Diabetes Maternal Uncle   . Breast cancer Maternal Grandmother 80  . Breast cancer Maternal Aunt 58  . Pancreatic cancer Maternal Aunt      Review of Systems   Constitutional: Negative.   HENT: Negative.   Eyes: Negative.   Respiratory: Negative.   Cardiovascular: Negative.   Gastrointestinal: Negative.   Endocrine: Negative.   Genitourinary: Negative.   Musculoskeletal: Negative.   Skin: Positive for rash.       Rash persists on vulva, reports better  Allergic/Immunologic: Negative.   Neurological: Negative.   Hematological: Negative.   Psychiatric/Behavioral: Negative.     PHYSICAL EXAMINATION:    BP 104/70   Pulse 70   Resp 16   Wt 145 lb (65.8 kg)   LMP 03/21/2012   BMI 25.28 kg/m     General appearance: alert, cooperative, no acute distress  Pelvic: External genitalia: much less edema, much less redness          Physical Exam Genitourinary:                       Chaperone,Joy,  CMA, was present for exam.  Assessment: Contact Dermatitis Possible HSV Vaginal atrophy (hx breast ca 15 years ago, on vaginal estrogen x several years)  Plan: Discussed that suspect allergic reaction to steroid vs solvent that medication is mixed in. For now, recommend no medication/no cream/no anything applied to vulva,(except cool compress and water). Appears to be healing well. In light of reduced redness and reduced swelling, am able to see multiple ulcerated areas (healing)  suspicious for HSV. Perhaps steroid lowered immunity enough that out break occurred. Pt has never had outbreak in past.  Will culture but also draw HSV antibodies in case unable to get enough sample for culture (r/t advanced healing).  Once results are in, will discuss follow up plan for ongoing maintenance.  Recommend biopsy to definitely diagnose if patient has lichen sclerosis before treating. Triamcinolone will be alternative regimen (see reference in UpToDate: [place] classification of cross-reactivity in allergic reactions to topical steroids)   Will change back to vagifem, Rx sent to [facility]

## 2020-04-29 ENCOUNTER — Telehealth: Payer: Self-pay

## 2020-04-29 NOTE — Telephone Encounter (Addendum)
Patient saw Cassandra Ganja, NP on 04/27/20 for vulvar irritation.  Plan: Suspect possible allergic reaction to possible clobetasol or compounded estrogen. Encouraged patient to discontinue all products, use zinc oxide (may mix with antibiotic cream if desires)  Patient called today to report that she is having "some blistering at the affected area".  Redness and weeping sores are better. Outer edges near buttocks has gotten worse. Red and raised like blisters.  Applying Desitin and Neosporin twice daily.  Walking is painful.  I informed patient that Cassandra Dickson is off today and offered to check with MD this afternoon but patient asked me just to wait and check with Cassandra Dickson., NP tomorrow morning. She said she has follow up visit on Friday with Cassandra Dickson as well.

## 2020-04-30 NOTE — Telephone Encounter (Signed)
Spoke to patient this morning. She states the area around her buttocks that had a rash has raised almost blister areas. She states it was very painful. She was using desitin & neosporin cream to areas. Patient states she stopped using all creams yesterday& just used a cool compress to area 2 to 3 times a day. She states the area is feeling better & slept pretty good last night. She just wanted to let you know she stopped all creams for now & will see [place] for her appointment tomorrow.  Routing to [employer], NP.

## 2020-05-01 ENCOUNTER — Encounter: Payer: Self-pay | Admitting: Nurse Practitioner

## 2020-05-01 ENCOUNTER — Ambulatory Visit (INDEPENDENT_AMBULATORY_CARE_PROVIDER_SITE_OTHER): Payer: 59 | Admitting: Nurse Practitioner

## 2020-05-01 ENCOUNTER — Other Ambulatory Visit: Payer: Self-pay

## 2020-05-01 VITALS — BP 104/70 | HR 70 | Resp 16 | Wt 145.0 lb

## 2020-05-01 DIAGNOSIS — N9089 Other specified noninflammatory disorders of vulva and perineum: Secondary | ICD-10-CM | POA: Diagnosis not present

## 2020-05-01 MED ORDER — ESTRADIOL 10 MCG VA TABS: 1.0000 | ORAL_TABLET | VAGINAL | 13 refills | Status: DC

## 2020-05-01 NOTE — Patient Instructions (Signed)
For now, do not use any medication, creams or treatment on the irritated skin. Only use cool compresses and water. Once results are in, will discuss further via My Chart.

## 2020-05-03 LAB — SURESWAB HSV, TYPE 1/2 DNA, PCR
HSV 1 DNA: NOT DETECTED
HSV 2 DNA: DETECTED — AB

## 2020-05-04 ENCOUNTER — Other Ambulatory Visit: Payer: Self-pay | Admitting: Nurse Practitioner

## 2020-05-04 LAB — HSV(HERPES SIMPLEX VRS) I + II AB-IGG
HAV 1 IGG,TYPE SPECIFIC AB: 2.34 index — ABNORMAL HIGH
HSV 2 IGG,TYPE SPECIFIC AB: 16.4 index — ABNORMAL HIGH

## 2020-05-04 MED ORDER — VALACYCLOVIR HCL 500 MG PO TABS: ORAL_TABLET | ORAL | 5 refills | Status: AC

## 2020-05-04 NOTE — Progress Notes (Signed)
Discussed HSV Rx sent for Valtrex. Will use for episodic treatment for now.

## 2020-05-12 ENCOUNTER — Other Ambulatory Visit: Payer: Self-pay | Admitting: Nurse Practitioner

## 2020-05-12 ENCOUNTER — Encounter: Payer: Self-pay | Admitting: Nurse Practitioner

## 2020-05-12 DIAGNOSIS — N9089 Other specified noninflammatory disorders of vulva and perineum: Secondary | ICD-10-CM

## 2020-05-14 ENCOUNTER — Other Ambulatory Visit: Payer: 59

## 2020-05-14 ENCOUNTER — Other Ambulatory Visit: Payer: 59 | Admitting: Obstetrics & Gynecology

## 2020-07-02 ENCOUNTER — Ambulatory Visit (INDEPENDENT_AMBULATORY_CARE_PROVIDER_SITE_OTHER): Payer: 59

## 2020-07-02 ENCOUNTER — Ambulatory Visit: Payer: 59 | Admitting: Obstetrics and Gynecology

## 2020-07-02 ENCOUNTER — Encounter: Payer: Self-pay | Admitting: Obstetrics and Gynecology

## 2020-07-02 ENCOUNTER — Other Ambulatory Visit: Payer: Self-pay

## 2020-07-02 VITALS — BP 116/76

## 2020-07-02 DIAGNOSIS — Z170 Estrogen receptor positive status [ER+]: Secondary | ICD-10-CM

## 2020-07-02 DIAGNOSIS — C50912 Malignant neoplasm of unspecified site of left female breast: Secondary | ICD-10-CM

## 2020-07-02 DIAGNOSIS — N9089 Other specified noninflammatory disorders of vulva and perineum: Secondary | ICD-10-CM | POA: Diagnosis not present

## 2020-07-02 DIAGNOSIS — Z1273 Encounter for screening for malignant neoplasm of ovary: Secondary | ICD-10-CM

## 2020-07-02 DIAGNOSIS — N854 Malposition of uterus: Secondary | ICD-10-CM | POA: Diagnosis not present

## 2020-07-02 DIAGNOSIS — Z853 Personal history of malignant neoplasm of breast: Secondary | ICD-10-CM

## 2020-07-02 NOTE — Progress Notes (Signed)
GYNECOLOGY  VISIT   HPI: 60 y.o.   Married White or Caucasian Not Hispanic or Latino  female   G1P0101 with Patient's last menstrual period was 03/21/2012.   here for  U/S check for ovarian cancer screening. h/o ER/PR + Breast cancer, off of tamoxifen (after 10 years) BRCA 1-2 negative Oncology aware of vaginal estrogen  She has been having issues with vulvar irritation for ~2 years. Dr Dellis Filbert had been treating her with steroids for presumed lichen sclerosis. She had worsening symptoms on the steroids this spring, then developed a severe primary HSV out break. Currently she is feeling intermittently irritated, sensitive on her vulva. Not itching. Not using steroids currently.  She walks 3 miles a day, just started using vaseline.   GYNECOLOGIC HISTORY: Patient's last menstrual period was 03/21/2012. Contraception:Postmenopausal Menopausal hormone therapy: estrogen vaginal cream        OB History     Gravida  1   Para  1   Term      Preterm  1   AB      Living  1      SAB      IAB      Ectopic      Multiple      Live Births  1              Patient Active Problem List   Diagnosis Date Noted   Hematuria of undiagnosed cause 11/18/2015   Breast cancer, left breast ([place]) 04/24/2014    Past Medical History:  Diagnosis Date   BRCA1 negative    BRCA2 negative    Cancer ([place]) 06/2004   LEFT BREAST CANCER.Marland Kitchen RADIATION / CHEMO   NSVD (normal spontaneous vaginal delivery)    Personal history of chemotherapy    Personal history of radiation therapy     Past Surgical History:  Procedure Laterality Date   BREAST LUMPECTOMY Left    BREAST SURGERY  2006   LEFT LUMPECTOMY   COLONOSCOPY  06/2007   BENIGN CECAL POLYP     Current Outpatient Medications  Medication Sig Dispense Refill   valACYclovir (VALTREX) 500 MG tablet Take 1 tablet twice daily x 3-5 days for outbreak 30 tablet 5   Biotin 5 MG CAPS Take 1 capsule by mouth.  0   cholecalciferol (VITAMIN D)  1000 UNITS tablet Take 1,000 Units by mouth daily.     Cyanocobalamin (B-12 PO) Take by mouth.     Estradiol (VAGIFEM) 10 MCG TABS vaginal tablet Place 1 tablet (10 mcg total) vaginally 2 (two) times a week. 8 tablet 13   gabapentin (NEURONTIN) 300 MG capsule TAKE 1 CAPSULE (300 MG TOTAL) BY MOUTH DAILY AS NEEDED. 30 capsule 0   Melatonin 1 MG CAPS Take by mouth.     NONFORMULARY OR COMPOUNDED ITEM Estradiol vaginal cream 0.02% cream Insert applicator intravaginally twice weekly. (Patient not taking: No sig reported) 90 each 3   No current facility-administered medications for this visit.     ALLERGIES: Patient has no known allergies.  Family History  Problem Relation Age of Onset   Cancer Father        LUNG   Hypertension Maternal Aunt    Diabetes Maternal Uncle    Breast cancer Maternal Grandmother 80   Breast cancer Maternal Aunt 45   Pancreatic cancer Maternal Aunt     Social History   Socioeconomic History   Marital status: Married    Spouse name: Not on file   Number  of children: Not on file   Years of education: Not on file   Highest education level: Not on file  Occupational History   Not on file  Tobacco Use   Smoking status: Never   Smokeless tobacco: Never  Vaping Use   Vaping Use: Never used  Substance and Sexual Activity   Alcohol use: Yes    Alcohol/week: 1.0 - 2.0 standard drink    Types: 1 - 2 Standard drinks or equivalent per week    Comment: WINE    Drug use: No   Sexual activity: Not Currently    Partners: Male    Birth control/protection: Post-menopausal, Other-see comments    Comment: Vasectomy  Other Topics Concern   Not on file  Social History Narrative   Not on file   Social Determinants of Health   Financial Resource Strain: Not on file  Food Insecurity: Not on file  Transportation Needs: Not on file  Physical Activity: Not on file  Stress: Not on file  Social Connections: Not on file  Intimate Partner Violence: Not on file     ROS  PHYSICAL EXAMINATION:    LMP 03/21/2012     General appearance: alert, cooperative and appears stated age Pelvic: External genitalia:  the left labia minora is slightly swollen and erythematous, there is aggultination of bilateral labia minora to majora. There is some whitening on the left vulva (under the labia minora), no fissures or plaques.               Urethra:  normal appearing urethra with no masses, tenderness or lesions               Chaperone was present for exam.  Pelvic ultrasound  Indications: ovarian cancer screening, personal h/o breast cancer  Findings:  Retroverted Uterus 4.65 x 3.4 x 2.6 cm  Endometrium 2.51 cm, symmetric  Left ovary 1.5 x 1.07 x 0.66 cm  Right ovary 2.04 x 1.12 x 0.72 cm  No free fluid   Impression: Normal pelvic ultrasound.    1. Screening for ovarian cancer Discussed screening for ovarian cancer and it's risks/limitations Information given from up to date  2. Vulvar lesion The area on the right isn't classic for lichen sclerosis, but the area on the left and the agglutination does look like lichen sclerosis She will return for vulvar biopsy x 2 to help guide future treatment  3. Vulvar irritation See above -Vulvar skin care reviewed and hand out given  4. History of breast cancer  In addition to reviewing the ultrasound, the patient was evaluated (history, exam, management) for her vulvar irritation and lesion. Recommendations given

## 2020-07-21 ENCOUNTER — Ambulatory Visit (INDEPENDENT_AMBULATORY_CARE_PROVIDER_SITE_OTHER): Payer: 59 | Admitting: Obstetrics and Gynecology

## 2020-07-21 ENCOUNTER — Other Ambulatory Visit: Payer: Self-pay

## 2020-07-21 ENCOUNTER — Other Ambulatory Visit (HOSPITAL_COMMUNITY)
Admission: RE | Admit: 2020-07-21 | Discharge: 2020-07-21 | Disposition: A | Discharge status: Home / Self Care | Payer: 59 | Source: Ambulatory Visit | Attending: Obstetrics and Gynecology | Admitting: Obstetrics and Gynecology

## 2020-07-21 ENCOUNTER — Encounter: Payer: Self-pay | Admitting: Obstetrics and Gynecology

## 2020-07-21 VITALS — BP 128/72 | HR 88 | Ht 64.0 in | Wt 147.0 lb

## 2020-07-21 DIAGNOSIS — N9089 Other specified noninflammatory disorders of vulva and perineum: Secondary | ICD-10-CM

## 2020-07-21 DIAGNOSIS — L900 Lichen sclerosus et atrophicus: Secondary | ICD-10-CM

## 2020-07-21 NOTE — Progress Notes (Signed)
GYNECOLOGY  VISIT   HPI: 60 y.o.   Married White or Caucasian Not Hispanic or Latino  female   [phone] with Patient's last menstrual period was 03/21/2012.   here for vulvar biopsy   She has been having issues with vulvar irritation for 2 years. Symptoms worsened on steroids. She has had a reaction to clobetasol in the past, brought in picture of a red reaction with excoriation on the vulva. She put the clobetasol on her hip and had some skin changes as well.                                                                                                                                                           GYNECOLOGIC HISTORY: Patient's last menstrual period was 03/21/2012. Contraception:Post menopausal  Menopausal hormone therapy: estradiol         OB History     Gravida  1   Para  1   Term      Preterm  1   AB      Living  1      SAB      IAB      Ectopic      Multiple      Live Births  1              Patient Active Problem List   Diagnosis Date Noted   Hematuria of undiagnosed cause 11/18/2015   Breast cancer, left breast ([place]) 04/24/2014    Past Medical History:  Diagnosis Date   BRCA1 negative    BRCA2 negative    Cancer ([place]) 06/2004   LEFT BREAST CANCER.Marland Kitchen RADIATION / CHEMO   NSVD (normal spontaneous vaginal delivery)    Personal history of chemotherapy    Personal history of radiation therapy     Past Surgical History:  Procedure Laterality Date   BREAST LUMPECTOMY Left    BREAST SURGERY  2006   LEFT LUMPECTOMY   COLONOSCOPY  06/2007   BENIGN CECAL POLYP     Current Outpatient Medications  Medication Sig Dispense Refill   Biotin 5 MG CAPS Take 1 capsule by mouth.  0   cholecalciferol (VITAMIN D) 1000 UNITS tablet Take 1,000 Units by mouth daily.     Cyanocobalamin (B-12 PO) Take by mouth.     Estradiol (VAGIFEM) 10 MCG TABS vaginal tablet Place 1 tablet (10 mcg total) vaginally 2 (two) times a week. 8 tablet 13   gabapentin  (NEURONTIN) 300 MG capsule TAKE 1 CAPSULE (300 MG TOTAL) BY MOUTH DAILY AS NEEDED. 30 capsule 0   Melatonin 1 MG CAPS Take by mouth.     valACYclovir (VALTREX) 500 MG tablet Take 1 tablet twice daily x 3-5 days for outbreak 30 tablet 5   No current facility-administered medications for this visit.  ALLERGIES: Clobetasol  Family History  Problem Relation Age of Onset   Cancer Father        LUNG   Hypertension Maternal Aunt    Diabetes Maternal Uncle    Breast cancer Maternal Grandmother 80   Breast cancer Maternal Aunt 45   Pancreatic cancer Maternal Aunt     Social History   Socioeconomic History   Marital status: Married    Spouse name: Not on file   Number of children: Not on file   Years of education: Not on file   Highest education level: Not on file  Occupational History   Not on file  Tobacco Use   Smoking status: Never   Smokeless tobacco: Never  Vaping Use   Vaping Use: Never used  Substance and Sexual Activity   Alcohol use: Yes    Alcohol/week: 1.0 - 2.0 standard drink    Types: 1 - 2 Standard drinks or equivalent per week    Comment: WINE    Drug use: No   Sexual activity: Not Currently    Partners: Male    Birth control/protection: Post-menopausal, Other-see comments    Comment: Vasectomy  Other Topics Concern   Not on file  Social History Narrative   Not on file   Social Determinants of Health   Financial Resource Strain: Not on file  Food Insecurity: Not on file  Transportation Needs: Not on file  Physical Activity: Not on file  Stress: Not on file  Social Connections: Not on file  Intimate Partner Violence: Not on file    ROS  PHYSICAL EXAMINATION:    BP 128/72   Pulse 88   Ht 5' 4"  (1.626 m)   Wt 147 lb (66.7 kg)   LMP 03/21/2012   SpO2 99%   BMI 25.23 kg/m     General appearance: alert, cooperative and appears stated age  Pelvic: External genitalia:  the entire right labia minora is very erythematous and slightly swollen.  The left labia minora has whitening and some dark pigment changes. There is agglutination of the labia minora to majora              Urethra:  normal appearing urethra with no masses, tenderness or lesions              Bartholins and Skenes: normal                  The risks of the procedure were reviewed with the patient and a consent was signed. The areas were cleansed with Hibiclens and injected with 1% lidocaine. A 45m punch biopsy was used to remove a circular piece of tissue on the right labia minora and also on the left labia minora. The defects were closed with 4-0 vicryl. The patient tolerated the procedure well.    Chaperone was present for exam.  1. Vulvar lesion - Surgical pathology( [place]/ [facility])  2. Vulvar irritation - Surgical pathology( [place]/ [facility])  Reaction to clobetasol. If she needs steroid, I will need to discuss this with a compounding pharmacist.   Patient's notes on her vulvar irritation were reviewed and scanned

## 2020-07-21 NOTE — Patient Instructions (Signed)

## 2020-07-22 ENCOUNTER — Encounter: Payer: Self-pay | Admitting: Obstetrics and Gynecology

## 2020-07-27 LAB — SURGICAL PATHOLOGY

## 2020-07-29 ENCOUNTER — Other Ambulatory Visit: Payer: Self-pay | Admitting: Obstetrics and Gynecology

## 2020-07-29 MED ORDER — NONFORMULARY OR COMPOUNDED ITEM: 1 refills | Status: DC

## 2020-07-29 NOTE — Progress Notes (Signed)
Clobetasol compounded ointment [facility].

## 2020-09-04 ENCOUNTER — Other Ambulatory Visit: Payer: Self-pay

## 2020-09-04 ENCOUNTER — Ambulatory Visit (INDEPENDENT_AMBULATORY_CARE_PROVIDER_SITE_OTHER): Payer: 59 | Admitting: Obstetrics and Gynecology

## 2020-09-04 ENCOUNTER — Encounter: Payer: Self-pay | Admitting: Obstetrics and Gynecology

## 2020-09-04 VITALS — BP 110/70 | HR 92 | Ht 64.0 in | Wt 149.0 lb

## 2020-09-04 DIAGNOSIS — L900 Lichen sclerosus et atrophicus: Secondary | ICD-10-CM | POA: Insufficient documentation

## 2020-09-04 DIAGNOSIS — N904 Leukoplakia of vulva: Secondary | ICD-10-CM

## 2020-09-04 DIAGNOSIS — N9089 Other specified noninflammatory disorders of vulva and perineum: Secondary | ICD-10-CM

## 2020-09-04 NOTE — Patient Instructions (Signed)
For the next week use the steroid ointment 1 x a day on the right labia minora.  Then use a pea sized amount on both sides and on the clitoris 2 x a week  F/U in 3 months.

## 2020-09-04 NOTE — Progress Notes (Signed)
GYNECOLOGY  VISIT   HPI: 60 y.o.   Married White or Caucasian Not Hispanic or Latino  female   [phone] with Patient's last menstrual period was 03/21/2012.   here for  follow up for severe vulvitis and lichen sclerosis.  Patient states that she is doing better.  She has a long h/o vulvar irritation, previously had a reaction to clobetasol. 2 vulvar biopsy were done last month. One biopsy returned with lichen sclerosis as expected, the other biopsy just returned with inflammation (negative for yeast). She was treated with compounded steroid ointments. No reaction to the steroid and she does feel it is better. Not bothering her at all, she did notice a slight amount of redness on the right (was completely resolved 2 weeks ago). She is concerned that her clitoris is disappearing, still sensitive but appears to be getting smaller.   She is using vaginal estrogen. Wants to be sexually active again, last was active in 11/21. The vulvitis was so bothersome that she couldn't be active. Will try again.   GYNECOLOGIC HISTORY: Patient's last menstrual period was 03/21/2012. Contraception:pmp  Menopausal hormone therapy:estradiol         OB History     Gravida  1   Para  1   Term      Preterm  1   AB      Living  1      SAB      IAB      Ectopic      Multiple      Live Births  1              Patient Active Problem List   Diagnosis Date Noted   Lichen sclerosus 81/10/3157   Hematuria of undiagnosed cause 11/18/2015   Breast cancer, left breast ([place]) 04/24/2014    Past Medical History:  Diagnosis Date   BRCA1 negative    BRCA2 negative    Cancer ([place]) 06/2004   LEFT BREAST CANCER.Marland Kitchen RADIATION / CHEMO   Lichen sclerosus    NSVD (normal spontaneous vaginal delivery)    Personal history of chemotherapy    Personal history of radiation therapy     Past Surgical History:  Procedure Laterality Date   BREAST LUMPECTOMY Left    BREAST SURGERY  2006   LEFT LUMPECTOMY    COLONOSCOPY  06/2007   BENIGN CECAL POLYP     Current Outpatient Medications  Medication Sig Dispense Refill   Biotin 5 MG CAPS Take 1 capsule by mouth.  0   cholecalciferol (VITAMIN D) 1000 UNITS tablet Take 1,000 Units by mouth daily.     Cyanocobalamin (B-12 PO) Take by mouth.     Estradiol (VAGIFEM) 10 MCG TABS vaginal tablet Place 1 tablet (10 mcg total) vaginally 2 (two) times a week. 8 tablet 13   gabapentin (NEURONTIN) 300 MG capsule TAKE 1 CAPSULE (300 MG TOTAL) BY MOUTH DAILY AS NEEDED. 30 capsule 0   Melatonin 1 MG CAPS Take by mouth.     NONFORMULARY OR COMPOUNDED ITEM Clobetasol powder in neutral base, 0.05%, apply a small amount topically BID for one month. 30 each 1   valACYclovir (VALTREX) 500 MG tablet Take 1 tablet twice daily x 3-5 days for outbreak 30 tablet 5   No current facility-administered medications for this visit.     ALLERGIES: Clobetasol  Family History  Problem Relation Age of Onset   Cancer Father        LUNG   Hypertension Maternal  Aunt    Diabetes Maternal Uncle    Breast cancer Maternal Grandmother 80   Breast cancer Maternal Aunt 45   Pancreatic cancer Maternal Aunt     Social History   Socioeconomic History   Marital status: Married    Spouse name: Not on file   Number of children: Not on file   Years of education: Not on file   Highest education level: Not on file  Occupational History   Not on file  Tobacco Use   Smoking status: Never   Smokeless tobacco: Never  Vaping Use   Vaping Use: Never used  Substance and Sexual Activity   Alcohol use: Yes    Alcohol/week: 1.0 - 2.0 standard drink    Types: 1 - 2 Standard drinks or equivalent per week    Comment: WINE    Drug use: No   Sexual activity: Not Currently    Partners: Male    Birth control/protection: Post-menopausal, Other-see comments    Comment: Vasectomy  Other Topics Concern   Not on file  Social History Narrative   Not on file   Social Determinants of Health    Financial Resource Strain: Not on file  Food Insecurity: Not on file  Transportation Needs: Not on file  Physical Activity: Not on file  Stress: Not on file  Social Connections: Not on file  Intimate Partner Violence: Not on file    ROS  PHYSICAL EXAMINATION:    BP 110/70   Pulse 92   Ht 5' 4"  (1.626 m)   Wt 149 lb (67.6 kg)   LMP 03/21/2012   SpO2 99%   BMI 25.58 kg/m     General appearance: alert, cooperative and appears stated age  Pelvic: External genitalia:  no lesions, on the outer right labia minora is a few mm area of erythema. Marked improvement from last month. She has mild agglutination of the labia minora to majora and of the right side of her clitoris to the labia majora. Few areas of pigmentation on the left labia minora. Prior whitening has resolved.               Urethra:  normal appearing urethra with no masses, tenderness or lesions              Bartholins and Skenes: normal                  Chaperone was present for exam.  1. Vulvar irritation Almost completely resolved with compounded steroid ointment.  She will use the ointment 1 x a day for the next week on the right labia minora (she wasn't putting the ointment on the upper labia minora where the erythema is). Then switch to 2 x a week   2. Lichen sclerosus et atrophicus of the vulva Use steroid ointment 2 x a week, f/u in 3 months  Over 20 minutes in total patient care.

## 2020-11-30 ENCOUNTER — Encounter: Payer: Self-pay | Admitting: Obstetrics and Gynecology

## 2020-11-30 ENCOUNTER — Other Ambulatory Visit: Payer: Self-pay

## 2020-11-30 ENCOUNTER — Ambulatory Visit (INDEPENDENT_AMBULATORY_CARE_PROVIDER_SITE_OTHER): Payer: 59 | Admitting: Obstetrics and Gynecology

## 2020-11-30 VITALS — BP 118/70 | HR 88 | Ht 64.0 in | Wt 149.0 lb

## 2020-11-30 DIAGNOSIS — N898 Other specified noninflammatory disorders of vagina: Secondary | ICD-10-CM | POA: Diagnosis not present

## 2020-11-30 DIAGNOSIS — N904 Leukoplakia of vulva: Secondary | ICD-10-CM

## 2020-11-30 NOTE — Progress Notes (Signed)
GYNECOLOGY  VISIT   HPI: 60 y.o.   Married White or Caucasian Not Hispanic or Latino  female   [phone] with Patient's last menstrual period was 03/21/2012.   here for follow up for vulvitis. She had 2 vulvar biopsies last summer that returned with lichen sclerosis, and inflammation. She was treated with steroid ointment. Currently using a small amount 2 x a week. She is feeling so much better. No itching.   She recently has been sexually active again. She is using vaginal estrogen. Still dry inside, she did use coconut oil.   GYNECOLOGIC HISTORY: Patient's last menstrual period was 03/21/2012. Contraception:pmp  Menopausal hormone therapy: estradiol tab          OB History     Gravida  1   Para  1   Term      Preterm  1   AB      Living  1      SAB      IAB      Ectopic      Multiple      Live Births  1              Patient Active Problem List   Diagnosis Date Noted   Lichen sclerosus 72/09/4707   Hematuria of undiagnosed cause 11/18/2015   Breast cancer, left breast ([place]) 04/24/2014    Past Medical History:  Diagnosis Date   BRCA1 negative    BRCA2 negative    Cancer ([place]) 06/2004   LEFT BREAST CANCER.Marland Kitchen RADIATION / CHEMO   Lichen sclerosus    NSVD (normal spontaneous vaginal delivery)    Personal history of chemotherapy    Personal history of radiation therapy     Past Surgical History:  Procedure Laterality Date   BREAST LUMPECTOMY Left    BREAST SURGERY  2006   LEFT LUMPECTOMY   COLONOSCOPY  06/2007   BENIGN CECAL POLYP     Current Outpatient Medications  Medication Sig Dispense Refill   Biotin 5 MG CAPS Take 1 capsule by mouth.  0   cholecalciferol (VITAMIN D) 1000 UNITS tablet Take 1,000 Units by mouth daily.     Cyanocobalamin (B-12 PO) Take by mouth.     Estradiol (VAGIFEM) 10 MCG TABS vaginal tablet Place 1 tablet (10 mcg total) vaginally 2 (two) times a week. 8 tablet 13   gabapentin (NEURONTIN) 300 MG capsule TAKE 1 CAPSULE  (300 MG TOTAL) BY MOUTH DAILY AS NEEDED. 30 capsule 0   Melatonin 1 MG CAPS Take by mouth.     NONFORMULARY OR COMPOUNDED ITEM Clobetasol powder in neutral base, 0.05%, apply a small amount topically BID for one month. 30 each 1   valACYclovir (VALTREX) 500 MG tablet Take 1 tablet twice daily x 3-5 days for outbreak 30 tablet 5   No current facility-administered medications for this visit.     ALLERGIES: Clobetasol  Family History  Problem Relation Age of Onset   Cancer Father        LUNG   Hypertension Maternal Aunt    Diabetes Maternal Uncle    Breast cancer Maternal Grandmother 80   Breast cancer Maternal Aunt 45   Pancreatic cancer Maternal Aunt     Social History   Socioeconomic History   Marital status: Married    Spouse name: Not on file   Number of children: Not on file   Years of education: Not on file   Highest education level: Not on file  Occupational History  Not on file  Tobacco Use   Smoking status: Never   Smokeless tobacco: Never  Vaping Use   Vaping Use: Never used  Substance and Sexual Activity   Alcohol use: Yes    Alcohol/week: 1.0 - 2.0 standard drink    Types: 1 - 2 Standard drinks or equivalent per week    Comment: WINE    Drug use: No   Sexual activity: Not Currently    Partners: Male    Birth control/protection: Post-menopausal, Other-see comments    Comment: Vasectomy  Other Topics Concern   Not on file  Social History Narrative   Not on file   Social Determinants of Health   Financial Resource Strain: Not on file  Food Insecurity: Not on file  Transportation Needs: Not on file  Physical Activity: Not on file  Stress: Not on file  Social Connections: Not on file  Intimate Partner Violence: Not on file    Review of Systems  All other systems reviewed and are negative.  PHYSICAL EXAMINATION:    BP 118/70   Pulse 88   Ht 5' 4"  (1.626 m)   Wt 149 lb (67.6 kg)   LMP 03/21/2012   SpO2 99%   BMI 25.58 kg/m     General  appearance: alert, cooperative and appears stated age Neck: no adenopathy, supple, symmetrical, trachea midline and thyroid normal to inspection and palpation Breasts: normal appearance, no masses or tenderness Abdomen: soft, non-tender; non distended, no masses,  no organomegaly  Pelvic: External genitalia:  no lesions, stable agglutination of the labia minora to the labia majora and from the right side of the clitoris to the labia majora.              Urethra:  normal appearing urethra with no masses, tenderness or lesions              Bartholins and Skenes: normal                  Chaperone, Gae Dry, was present for exam   1. Lichen sclerosus et atrophicus of the vulva Stable Continue with steroid ointment 2 x a week  2. Vaginal dryness Still mild dryness with intercourse. Using vaginal estrogen. -Try uberlube for vaginal lubrication -She should control the rate and depth of penetration

## 2020-11-30 NOTE — Patient Instructions (Signed)
Try uberlube for vaginal lubrication.

## 2021-02-22 ENCOUNTER — Other Ambulatory Visit: Payer: Self-pay | Admitting: Obstetrics & Gynecology

## 2021-02-22 ENCOUNTER — Other Ambulatory Visit: Payer: Self-pay | Admitting: Internal Medicine

## 2021-02-22 DIAGNOSIS — Z1231 Encounter for screening mammogram for malignant neoplasm of breast: Secondary | ICD-10-CM

## 2021-02-26 ENCOUNTER — Ambulatory Visit
Admission: RE | Admit: 2021-02-26 | Discharge: 2021-02-26 | Disposition: A | Discharge status: Home / Self Care | Payer: Managed Care, Other (non HMO) | Source: Ambulatory Visit | Attending: Internal Medicine | Admitting: Internal Medicine

## 2021-02-26 ENCOUNTER — Other Ambulatory Visit: Payer: Self-pay

## 2021-02-26 DIAGNOSIS — Z1231 Encounter for screening mammogram for malignant neoplasm of breast: Secondary | ICD-10-CM

## 2021-04-02 ENCOUNTER — Ambulatory Visit: Payer: 59 | Admitting: Nurse Practitioner

## 2021-04-05 ENCOUNTER — Ambulatory Visit: Payer: 59 | Admitting: Nurse Practitioner

## 2021-05-04 ENCOUNTER — Other Ambulatory Visit: Payer: Self-pay

## 2021-05-05 ENCOUNTER — Other Ambulatory Visit: Payer: Self-pay

## 2021-05-17 ENCOUNTER — Other Ambulatory Visit: Payer: Self-pay | Admitting: *Deleted

## 2021-05-17 MED ORDER — ESTRADIOL 10 MCG VA TABS: 1.0000 | ORAL_TABLET | VAGINAL | 0 refills | Status: DC

## 2021-05-17 NOTE — Telephone Encounter (Signed)
Patient called requesting refill on vagifem 10 mcg tablet ?Annual exam scheduled on 06/24/21 ?Last annual exam was 03/2020 ?Last mammogram 02/2021 ?

## 2021-06-17 NOTE — Progress Notes (Signed)
61 y.o. G8P0101 Married White or Caucasian Not Hispanic or Latino female here for annual exam.  No vaginal bleeding. H/O breast cancer in 2006, off of tamoxifen since 2017. She is using vaginal estrogen, Oncology aware. She is still having pain with intercourse, has only had IC 2 x in 6 months. Feels like it is too tight. Doesn't feel she is tearing. Never moist enough, lubricant isn't enough.  She is having trouble with vulvar irritation, little more irritated in the last few weeks. H/O vulvitis and lichen sclerosis. Last year she had a reaction to clobetasol, but did much better with a compounded steroid ointment.     Patient's last menstrual period was 03/21/2012.          Sexually active: Yes.    The current method of family planning is post menopausal status.    Exercising: Yes.     Body pump, yoga, walking  Smoker:  no  Health Maintenance: Pap:  04/01/19 WNL  History of abnormal Pap:  yes had cryo surgery in her 20's  MMG:  03/01/21 density B Bi-rads 2 benign  BMD:   11/01/17 Osteopenia F/U 2 years, -1.2  Colonoscopy: 05/22/13 f/u 10 years  TDaP:  05/10/12 Gardasil: n/a   reports that she has never smoked. She has never used smokeless tobacco. She reports current alcohol use of about 1.0 - 2.0 standard drink of alcohol per week. She reports that she does not use drugs. 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 Son is 23, in [place]. She helps take care of her Mom, just moved into independent living in [place].   Past Medical History:  Diagnosis Date   BRCA1 negative    BRCA2 negative    Cancer ([place]) 06/2004   LEFT BREAST CANCER.Marland Kitchen RADIATION / CHEMO   Lichen sclerosus    NSVD (normal spontaneous vaginal delivery)    Personal history of chemotherapy    Personal history of radiation therapy     Past Surgical History:  Procedure Laterality Date   BREAST LUMPECTOMY Left    BREAST SURGERY  2006   LEFT LUMPECTOMY   COLONOSCOPY  06/2007   BENIGN CECAL POLYP     Current Outpatient  Medications  Medication Sig Dispense Refill   Biotin 5 MG CAPS Take 1 capsule by mouth.  0   cholecalciferol (VITAMIN D) 1000 UNITS tablet Take 1,000 Units by mouth daily.     Cyanocobalamin (B-12 PO) Take by mouth.     Estradiol (VAGIFEM) 10 MCG TABS vaginal tablet Place 1 tablet (10 mcg total) vaginally 2 (two) times a week. 24 tablet 0   gabapentin (NEURONTIN) 300 MG capsule TAKE 1 CAPSULE (300 MG TOTAL) BY MOUTH DAILY AS NEEDED. 30 capsule 0   Melatonin 1 MG CAPS Take by mouth.     NONFORMULARY OR COMPOUNDED ITEM Clobetasol powder in neutral base, 0.05%, apply a small amount topically BID for one month. 30 each 1   valACYclovir (VALTREX) 500 MG tablet Take 1 tablet twice daily x 3-5 days for outbreak 30 tablet 5   No current facility-administered medications for this visit.    Family History  Problem Relation Age of Onset   Cancer Father        LUNG   Hypertension Maternal Aunt    Diabetes Maternal Uncle    Breast cancer Maternal Grandmother 96   Breast cancer Maternal Aunt 45   Pancreatic cancer Maternal Aunt     Review of Systems  Eyes:  Positive for discharge.  All other systems  reviewed and are negative.   Exam:   LMP 03/21/2012   Weight change: @WEIGHTCHANGE @ Height:      Ht Readings from Last 3 Encounters:  11/30/20 5' 4"  (1.626 m)  09/04/20 5' 4"  (1.626 m)  07/21/20 5' 4"  (1.626 m)    General appearance: alert, cooperative and appears stated age Head: Normocephalic, without obvious abnormality, atraumatic Neck: no adenopathy, supple, symmetrical, trachea midline and thyroid normal to inspection and palpation Lungs: clear to auscultation bilaterally Cardiovascular: regular rate and rhythm Breasts: normal appearance, no masses or tenderness Abdomen: soft, non-tender; non distended,  no masses,  no organomegaly Extremities: extremities normal, atraumatic, no cyanosis or edema Skin: Skin color, texture, turgor normal. No rashes or lesions Lymph nodes: Cervical,  supraclavicular, and axillary nodes normal. No abnormal inguinal nodes palpated Neurologic: Grossly normal   Pelvic: External genitalia:  no lesions. The left labia minora is mildly swollen and the inner portion is erythematous (not as bad as last year), no               Urethra:  normal appearing urethra with no masses, tenderness or lesions              Bartholins and Skenes: normal                 Vagina: normal appearing vagina with normal color and discharge, no lesions, appears well estrogenized.              Cervix: no lesions               Bimanual Exam:  Uterus:  normal size, contour, position, consistency, mobility, non-tender              Adnexa: no mass, fullness, tenderness               Rectovaginal: Confirms               Anus:  normal sphincter tone, no lesions  Gae Dry chaperoned for the exam.  1. Well woman exam Discussed breast self exam Discussed calcium and vit D intake Mammogram and colonoscopy are UTD  2. History of breast cancer Doing well Mammogram is UTD  3. Lichen sclerosus et atrophicus of the vulva Well controlled, will refill her compounded steroid ointment   4. Vulvar irritation - WET PREP FOR TRICH, YEAST, CLUE: + yeast  5. Dyspareunia in female - estradiol (ESTRACE) 0.1 MG/GM vaginal cream; 1 gram vaginally twice weekly  Dispense: 42.5 g; Refill: 1 -If she doesn't feel better after treating the yeast, will buy vaginal dilators  6. Vaginal atrophy - estradiol (ESTRACE) 0.1 MG/GM vaginal cream; 1 gram vaginally twice weekly  Dispense: 42.5 g; Refill: 1  7. Yeast vaginitis - fluconazole (DIFLUCAN) 150 MG tablet; Take 1 tablet (150 mg total) by mouth once for 1 dose. Take one tablet.  Repeat in 72 hours if symptoms are not completely resolved.  Dispense: 2 tablet; Refill: 0

## 2021-06-24 ENCOUNTER — Encounter: Payer: Self-pay | Admitting: Obstetrics and Gynecology

## 2021-06-24 ENCOUNTER — Ambulatory Visit (INDEPENDENT_AMBULATORY_CARE_PROVIDER_SITE_OTHER): Payer: Commercial Managed Care - HMO | Admitting: Obstetrics and Gynecology

## 2021-06-24 ENCOUNTER — Other Ambulatory Visit: Payer: Self-pay | Admitting: Obstetrics and Gynecology

## 2021-06-24 VITALS — BP 110/67 | HR 76 | Ht 63.5 in | Wt 145.0 lb

## 2021-06-24 DIAGNOSIS — N9089 Other specified noninflammatory disorders of vulva and perineum: Secondary | ICD-10-CM | POA: Diagnosis not present

## 2021-06-24 DIAGNOSIS — N952 Postmenopausal atrophic vaginitis: Secondary | ICD-10-CM

## 2021-06-24 DIAGNOSIS — N941 Unspecified dyspareunia: Secondary | ICD-10-CM

## 2021-06-24 DIAGNOSIS — B3731 Acute candidiasis of vulva and vagina: Secondary | ICD-10-CM

## 2021-06-24 DIAGNOSIS — Z853 Personal history of malignant neoplasm of breast: Secondary | ICD-10-CM | POA: Diagnosis not present

## 2021-06-24 DIAGNOSIS — N904 Leukoplakia of vulva: Secondary | ICD-10-CM

## 2021-06-24 DIAGNOSIS — Z01419 Encounter for gynecological examination (general) (routine) without abnormal findings: Secondary | ICD-10-CM | POA: Diagnosis not present

## 2021-06-24 LAB — WET PREP FOR TRICH, YEAST, CLUE

## 2021-06-24 MED ORDER — FLUCONAZOLE 150 MG PO TABS: 150.0000 mg | ORAL_TABLET | Freq: Once | ORAL | 0 refills | Status: AC

## 2021-06-24 MED ORDER — ESTRADIOL 0.1 MG/GM VA CREA: TOPICAL_CREAM | VAGINAL | 1 refills | Status: DC

## 2021-06-24 NOTE — Patient Instructions (Addendum)
Increase the steroid ointment to 2 x a day for up to 1 week on the area of irritation, 3 x a week to the rest of the vulva. After one week, apply the steroid ointment topically 3 x a week baseline.  Order vaginal dilators on line if you don't feel better after treating the yeast infection.  Vaginal Yeast Infection, Adult  Vaginal yeast infection is a condition that causes vaginal discharge as well as soreness, swelling, and redness (inflammation) of the vagina. This is a common condition. Some women get this infection frequently. What are the causes? This condition is caused by a change in the normal balance of the yeast (Candida) and normal bacteria that live in the vagina. This change causes an overgrowth of yeast, which causes the inflammation. What increases the risk? The condition is more likely to develop in women who: Take antibiotic medicines. Have diabetes. Take birth control pills. Are pregnant. Douche often. Have a weak body defense system (immune system). Have been taking steroid medicines for a long time. Frequently wear tight clothing. What are the signs or symptoms? Symptoms of this condition include: White, thick, creamy vaginal discharge. Swelling, itching, redness, and irritation of the vagina. The lips of the vagina (labia) may be affected as well. Pain or a burning feeling while urinating. Pain during sex. How is this diagnosed? This condition is diagnosed based on: Your medical history. A physical exam. A pelvic exam. Your health care provider will examine a sample of your vaginal discharge under a microscope. Your health care provider may send this sample for testing to confirm the diagnosis. How is this treated? This condition is treated with medicine. Medicines may be over-the-counter or prescription. You may be told to use one or more of the following: Medicine that is taken by mouth (orally). Medicine that is applied as a cream (topically). Medicine that is  inserted directly into the vagina (suppository). Follow these instructions at home: Take or apply over-the-counter and prescription medicines only as told by your health care provider. Do not use tampons until your health care provider approves. Do not have sex until your infection has cleared. Sex can prolong or worsen your symptoms of infection. Ask your health care provider when it is safe to resume sexual activity. Keep all follow-up visits. This is important. How is this prevented?  Do not wear tight clothes, such as pantyhose or tight pants. Wear breathable cotton underwear. Do not use douches, perfumed soap, creams, or powders. Wipe from front to back after using the toilet. If you have diabetes, keep your blood sugar levels under control. Ask your health care provider for other ways to prevent yeast infections. Contact a health care provider if: You have a fever. Your symptoms go away and then return. Your symptoms do not get better with treatment. Your symptoms get worse. You have new symptoms. You develop blisters in or around your vagina. You have blood coming from your vagina and it is not your menstrual period. You develop pain in your abdomen. Summary Vaginal yeast infection is a condition that causes discharge as well as soreness, swelling, and redness (inflammation) of the vagina. This condition is treated with medicine. Medicines may be over-the-counter or prescription. Take or apply over-the-counter and prescription medicines only as told by your health care provider. Do not douche. Resume sexual activity or use of tampons as instructed by your health care provider. Contact a health care provider if your symptoms do not get better with treatment or your symptoms  go away and then return. This information is not intended to replace advice given to you by your health care provider. Make sure you discuss any questions you have with your health care provider. Document  Revised: 03/16/2020 Document Reviewed: 03/16/2020 Elsevier Patient Education  [place]   We recommended that you start or continue a regular exercise program for good health. Physical activity is anything that gets your body moving, some is better than none. The CDC recommends 150 minutes per week of Moderate-Intensity Aerobic Activity and 2 or more days of Muscle Strengthening Activity.  Benefits of exercise are limitless: helps weight loss/weight maintenance, improves mood and energy, helps with depression and anxiety, improves sleep, tones and strengthens muscles, improves balance, improves bone density, protects from chronic conditions such as heart disease, high blood pressure and diabetes and so much more. To learn more visit: WhyNotPoker.uy  DIET: Good nutrition starts with a healthy diet of fruits, vegetables, whole grains, and lean protein sources. Drink plenty of water for hydration. Minimize empty calories, sodium, sweets. For more information about dietary recommendations visit: GeekRegister.com.ee and http://schaefer-mitchell.com/  ALCOHOL:  Women should limit their alcohol intake to no more than 7 drinks/beers/glasses of wine (combined, not each!) per week. Moderation of alcohol intake to this level decreases your risk of breast cancer and liver damage.  If you are concerned that you may have a problem, or your friends have told you they are concerned about your drinking, there are many resources to help. A well-known program that is free, effective, and available to all people all over the nation is Alcoholics Anonymous.  Check out this site to learn more: BlockTaxes.se   CALCIUM AND VITAMIN D:  Adequate intake of calcium and Vitamin D are recommended for bone health.  You should be getting between 1000-1200 mg of calcium and 800 units of Vitamin D daily between diet and  supplements  PAP SMEARS:  Pap smears, to check for cervical cancer or precancers,  have traditionally been done yearly, scientific advances have shown that most women can have pap smears less often.  However, every woman still should have a physical exam from her gynecologist every year. It will include a breast check, inspection of the vulva and vagina to check for abnormal growths or skin changes, a visual exam of the cervix, and then an exam to evaluate the size and shape of the uterus and ovaries. We will also provide age appropriate advice regarding health maintenance, like when you should have certain vaccines, screening for sexually transmitted diseases, bone density testing, colonoscopy, mammograms, etc.   MAMMOGRAMS:  All women over 30 years old should have a routine mammogram.   COLON CANCER SCREENING: Now recommend starting at age 84. At this time colonoscopy is not covered for routine screening until 50. There are take home tests that can be done between 45-49.   COLONOSCOPY:  Colonoscopy to screen for colon cancer is recommended for all women at age 35.  We know, you hate the idea of the prep.  We agree, BUT, having colon cancer and not knowing it is worse!!  Colon cancer so often starts as a polyp that can be seen and removed at colonscopy, which can quite literally save your life!  And if your first colonoscopy is normal and you have no family history of colon cancer, most women don't have to have it again for 10 years.  Once every ten years, you can do something that may end up  saving your life, right?  We will be happy to help you get it scheduled when you are ready.  Be sure to check your insurance coverage so you understand how much it will cost.  It may be covered as a preventative service at no cost, but you should check your particular policy.      Breast Self-Awareness Breast self-awareness means being familiar with how your breasts look and feel. It involves checking your breasts  regularly and reporting any changes to your health care provider. Practicing breast self-awareness is important. A change in your breasts can be a sign of a serious medical problem. Being familiar with how your breasts look and feel allows you to find any problems early, when treatment is more likely to be successful. All women should practice breast self-awareness, including women who have had breast implants. How to do a breast self-exam One way to learn what is normal for your breasts and whether your breasts are changing is to do a breast self-exam. To do a breast self-exam: Look for Changes  Remove all the clothing above your waist. Stand in front of a mirror in a room with good lighting. Put your hands on your hips. Push your hands firmly downward. Compare your breasts in the mirror. Look for differences between them (asymmetry), such as: Differences in shape. Differences in size. Puckers, dips, and bumps in one breast and not the other. Look at each breast for changes in your skin, such as: Redness. Scaly areas. Look for changes in your nipples, such as: Discharge. Bleeding. Dimpling. Redness. A change in position. Feel for Changes Carefully feel your breasts for lumps and changes. It is best to do this while lying on your back on the floor and again while sitting or standing in the shower or tub with soapy water on your skin. Feel each breast in the following way: Place the arm on the side of the breast you are examining above your head. Feel your breast with the other hand. Start in the nipple area and make  inch (2 cm) overlapping circles to feel your breast. Use the pads of your three middle fingers to do this. Apply light pressure, then medium pressure, then firm pressure. The light pressure will allow you to feel the tissue closest to the skin. The medium pressure will allow you to feel the tissue that is a little deeper. The firm pressure will allow you to feel the tissue  close to the ribs. Continue the overlapping circles, moving downward over the breast until you feel your ribs below your breast. Move one finger-width toward the center of the body. Continue to use the  inch (2 cm) overlapping circles to feel your breast as you move slowly up toward your collarbone. Continue the up and down exam using all three pressures until you reach your armpit.  Write Down What You Find  Write down what is normal for each breast and any changes that you find. Keep a written record with breast changes or normal findings for each breast. By writing this information down, you do not need to depend only on memory for size, tenderness, or location. Write down where you are in your menstrual cycle, if you are still menstruating. If you are having trouble noticing differences in your breasts, do not get discouraged. With time you will become more familiar with the variations in your breasts and more comfortable with the exam. How often should I examine my breasts? Examine your breasts every month.  If you are breastfeeding, the best time to examine your breasts is after a feeding or after using a breast pump. If you menstruate, the best time to examine your breasts is 5-7 days after your period is over. During your period, your breasts are lumpier, and it may be more difficult to notice changes. When should I see my health care provider? See your health care provider if you notice: A change in shape or size of your breasts or nipples. A change in the skin of your breast or nipples, such as a reddened or scaly area. Unusual discharge from your nipples. A lump or thick area that was not there before. Pain in your breasts. Anything that concerns you.

## 2021-06-25 ENCOUNTER — Telehealth: Payer: Self-pay | Admitting: *Deleted

## 2021-06-25 MED ORDER — NONFORMULARY OR COMPOUNDED ITEM: 2 refills | Status: DC

## 2021-06-25 NOTE — Telephone Encounter (Signed)
Rx called in 

## 2021-06-25 NOTE — Telephone Encounter (Signed)
-----   Message from Salvadore Dom, MD sent at 06/24/2021  3:52 PM EDT ----- Please refill her compounded clobetasol script with the following changes (allergy to regular clobetasol). Apply a pea sized amount to the affected area BID for one week, then use a pea sized amount 3 x a week.  30 grams, 2 refills Thanks

## 2021-06-25 NOTE — Telephone Encounter (Signed)
[facility] sent message via epic estrace 0.1 vaginal cream  " not covered by patient insurance"  PA pending with [employer] via cover my meds

## 2021-07-02 ENCOUNTER — Encounter: Payer: Self-pay | Admitting: *Deleted

## 2021-07-02 NOTE — Telephone Encounter (Signed)
She doesn't need and isn't a candidate for an estrogen patch, she just needs vaginal estrogen for vaginal atrophy. She doesn't like the vagifem and requested the cream. I thought the cream was now generic and inexpensive? Can she use good rx for it? If not, see if she wants the vagifem again.

## 2021-08-10 NOTE — Telephone Encounter (Signed)
Appeal was denied by [facility] medication is not considered medically necessary. No intolerance for estradiol patches and vagifem. Left message for patient to call and relay.

## 2021-08-13 NOTE — Telephone Encounter (Signed)
FYI. Pt called stating that she was actually paying OOP for vagifem tabs too when she was prescribed those due to her insurance not wanting to cover and she was paying around $45 about q62moor so. She was grateful that JAnderson Maltatried to appeal but is understandable that we didn't get an approval.   Pt advised the w/ [facility] for one tube of estradiol cream that could last her ~552month if taken correctly was $57.60.  She voiced understanding and is happy with that. She will notify usKoreaf there is anything else she needs.

## 2022-02-15 ENCOUNTER — Encounter: Payer: Self-pay | Admitting: Obstetrics and Gynecology

## 2022-02-15 DIAGNOSIS — N952 Postmenopausal atrophic vaginitis: Secondary | ICD-10-CM

## 2022-02-15 DIAGNOSIS — N941 Unspecified dyspareunia: Secondary | ICD-10-CM

## 2022-02-15 MED ORDER — ESTRADIOL 0.1 MG/GM VA CREA: TOPICAL_CREAM | VAGINAL | 0 refills | Status: DC

## 2022-02-15 NOTE — Telephone Encounter (Signed)
Last AEX 06/24/2021--scheduled for 06/28/2022 Last mammo 02/26/2021-birads 2 benign (will remind pt to schedule)  Rx pending w/ correct pharmacy.

## 2022-03-03 ENCOUNTER — Other Ambulatory Visit: Payer: Self-pay | Admitting: Obstetrics and Gynecology

## 2022-03-03 DIAGNOSIS — Z1231 Encounter for screening mammogram for malignant neoplasm of breast: Secondary | ICD-10-CM

## 2022-04-15 ENCOUNTER — Ambulatory Visit
Admission: RE | Admit: 2022-04-15 | Discharge: 2022-04-15 | Disposition: A | Discharge status: Home / Self Care | Payer: Commercial Managed Care - HMO | Source: Ambulatory Visit | Attending: Obstetrics and Gynecology | Admitting: Obstetrics and Gynecology

## 2022-04-15 DIAGNOSIS — Z1231 Encounter for screening mammogram for malignant neoplasm of breast: Secondary | ICD-10-CM

## 2022-06-21 NOTE — Progress Notes (Signed)
62 y.o. [id number] Married White or Caucasian Not Hispanic or Latino female here for annual exam.  No vaginal bleeding.   H/O breast cancer in 2006, off of tamoxifen since 2017. She is using vaginal estrogen, Oncology was aware. The cream is helping more than the tablet did. No longer having with intercourse.    H/O lichen sclerosis. Just occasional discomfort, uses a pea sized amount 2 x a week.   Never had an HSV outbreak, declines refill of valtrex.   Patient's last menstrual period was 03/21/2012.          Sexually active: Yes.    The current method of family planning is post menopausal status.    Exercising: Yes.     Classes, yoga weights and walking  Smoker:  no  Health Maintenance: Pap:04/01/19 WNL  History of abnormal Pap:  yes had cryo surgery in her 20's  MMG:  04/18/22 Bi-rads 1 neg  BMD: 11/01/17 Osteopenia F/U 2 years, -1.2 Colonoscopy: 05/22/13 f/u 10 years  TDaP:  had one in 2022 when she had a grandchild.  Gardasil: n/a   reports that she has never smoked. She has never used smokeless tobacco. She reports current alcohol use of about 1.0 - 2.0 standard drink of alcohol per week. She reports that she does not use drugs. Has a 74.44 year old grandson, has a 37 month old granddaughter.   Past Medical History:  Diagnosis Date   BRCA1 negative    BRCA2 negative    Cancer (HCC) 06/2004   LEFT BREAST CANCER.Mar[facility] Kitchen RADIATION / CHEMO   Lichen sclerosus    NSVD (normal spontaneous vaginal delivery)    Personal history of chemotherapy    Personal history of radiation therapy     Past Surgical History:  Procedure Laterality Date   BREAST LUMPECTOMY Left    BREAST SURGERY  2006   LEFT LUMPECTOMY   COLONOSCOPY  06/2007   BENIGN CECAL POLYP     Current Outpatient Medications  Medication Sig Dispense Refill   cholecalciferol (VITAMIN D) 1000 UNITS tablet Take 1,000 Units by mouth daily.     estradiol (ESTRACE) 0.1 MG/GM vaginal cream 1 gram vaginally twice weekly 42.5 g 0    gabapentin (NEURONTIN) 300 MG capsule TAKE 1 CAPSULE (300 MG TOTAL) BY MOUTH DAILY AS NEEDED. 30 capsule 0   Melatonin 1 MG CAPS Take by mouth.     NONFORMULARY OR COMPOUNDED ITEM Clobetasol powder in neutral base, 0.05%, Apply a pea sized amount to the affected area twice a day for one week, then use a pea sized amount 3 times a week. 30 each 2   valACYclovir (VALTREX) 500 MG tablet Take 1 tablet twice daily x 3-5 days for outbreak 30 tablet 5   No current facility-administered medications for this visit.    Family History  Problem Relation Age of Onset   Cancer Father        LUNG   Hypertension Maternal Aunt    Diabetes Maternal Uncle    Breast cancer Maternal Grandmother 65   Breast cancer Maternal Aunt 45   Pancreatic cancer Maternal Aunt     Review of Systems  All other systems reviewed and are negative.   Exam:   BP 118/72   Pulse 64   Ht 5\' 4"  (1.626 m)   Wt 150 lb 3.2 oz (68.1 kg)   LMP 03/21/2012   SpO2 100%   BMI 25.78 kg/m   Weight change: @WEIGHTCHANGE @ Height:   Height: 5\' 4"  (162.6  cm)  Ht Readings from Last 3 Encounters:  06/28/22 5\' 4"  (1.626 m)  06/24/21 5' 3.5" (1.613 m)  11/30/20 5\' 4"  (1.626 m)    General appearance: alert, cooperative and appears stated age Head: Normocephalic, without obvious abnormality, atraumatic Neck: no adenopathy, supple, symmetrical, trachea midline and thyroid normal to inspection and palpation Lungs: clear to auscultation bilaterally Cardiovascular: regular rate and rhythm Breasts: normal appearance, no masses or tenderness Abdomen: soft, non-tender; non distended,  no masses,  no organomegaly Extremities: extremities normal, atraumatic, no cyanosis or edema Skin: Skin color, texture, turgor normal. No rashes or lesions Lymph nodes: Cervical, supraclavicular, and axillary nodes normal. No abnormal inguinal nodes palpated Neurologic: Grossly normal   Pelvic: External genitalia:  no lesions, the right labia minor is  erythematous and mildly swollen, mild loss of architecture, no whitening.               Urethra:  normal appearing urethra with no masses, tenderness or lesions              Bartholins and Skenes: normal                 Vagina: normal appearing vagina with normal color and discharge, no lesions              Cervix: no lesions               Bimanual Exam:  Uterus:  normal size, contour, position, consistency, mobility, non-tender              Adnexa: no mass, fullness, tenderness               Rectovaginal: Confirms               Anus:  normal sphincter tone, no lesions  Carolynn Serve, CMA chaperoned for the exam.  1. Well woman exam Discussed breast self exam Discussed calcium and vit D intake Mammogram UTD Colonoscopy due next year Labs with primary   2. Lichen sclerosus et atrophicus of the vulva Stable, will refill her compounded clobetasol ointment to use 2 x a week  3. History of breast cancer  4. Vaginal atrophy Much better with the vaginal estrogen cream - estradiol (ESTRACE) 0.1 MG/GM vaginal cream; 1 gram vaginally twice weekly  Dispense: 42.5 g; Refill: 1  5. Screening for cervical cancer - Cytology - PAP  6. Osteopenia, unspecified location - DG Bone Density; Future  7. Dyspareunia in female Improved

## 2022-06-28 ENCOUNTER — Encounter: Payer: Self-pay | Admitting: Obstetrics and Gynecology

## 2022-06-28 ENCOUNTER — Ambulatory Visit (INDEPENDENT_AMBULATORY_CARE_PROVIDER_SITE_OTHER): Payer: Commercial Managed Care - HMO | Admitting: Obstetrics and Gynecology

## 2022-06-28 ENCOUNTER — Other Ambulatory Visit (HOSPITAL_COMMUNITY)
Admission: RE | Admit: 2022-06-28 | Discharge: 2022-06-28 | Disposition: A | Discharge status: Home / Self Care | Payer: Commercial Managed Care - HMO | Source: Ambulatory Visit | Attending: Obstetrics and Gynecology | Admitting: Obstetrics and Gynecology

## 2022-06-28 VITALS — BP 118/72 | HR 64 | Ht 64.0 in | Wt 150.2 lb

## 2022-06-28 DIAGNOSIS — Z853 Personal history of malignant neoplasm of breast: Secondary | ICD-10-CM | POA: Diagnosis not present

## 2022-06-28 DIAGNOSIS — N952 Postmenopausal atrophic vaginitis: Secondary | ICD-10-CM

## 2022-06-28 DIAGNOSIS — Z124 Encounter for screening for malignant neoplasm of cervix: Secondary | ICD-10-CM | POA: Diagnosis present

## 2022-06-28 DIAGNOSIS — Z01419 Encounter for gynecological examination (general) (routine) without abnormal findings: Secondary | ICD-10-CM

## 2022-06-28 DIAGNOSIS — Z1211 Encounter for screening for malignant neoplasm of colon: Secondary | ICD-10-CM | POA: Insufficient documentation

## 2022-06-28 DIAGNOSIS — M8580 Other specified disorders of bone density and structure, unspecified site: Secondary | ICD-10-CM

## 2022-06-28 DIAGNOSIS — N941 Unspecified dyspareunia: Secondary | ICD-10-CM

## 2022-06-28 DIAGNOSIS — N904 Leukoplakia of vulva: Secondary | ICD-10-CM | POA: Diagnosis not present

## 2022-06-28 DIAGNOSIS — K648 Other hemorrhoids: Secondary | ICD-10-CM | POA: Insufficient documentation

## 2022-06-28 MED ORDER — ESTRADIOL 0.1 MG/GM VA CREA: TOPICAL_CREAM | VAGINAL | 1 refills | Status: DC

## 2022-06-28 NOTE — Patient Instructions (Signed)

## 2022-06-30 LAB — CYTOLOGY - PAP
Adequacy: ABSENT
Comment: NEGATIVE
Diagnosis: NEGATIVE
High risk HPV: NEGATIVE

## 2022-07-01 ENCOUNTER — Telehealth: Payer: Self-pay

## 2022-07-01 DIAGNOSIS — N904 Leukoplakia of vulva: Secondary | ICD-10-CM

## 2022-07-01 NOTE — Telephone Encounter (Signed)
06/28/2022 @ 1634: LDVM per DPR requesting for pt to CB and inform us which compounding pharmacy that she uses. tg

## 2022-07-01 NOTE — Telephone Encounter (Signed)
-----   Message from Romualdo Bolk, MD sent at 06/28/2022  3:32 PM EDT ----- Please refill her compounded clobetasol, 30 grams 2 refills

## 2022-07-04 ENCOUNTER — Other Ambulatory Visit: Payer: Self-pay

## 2022-07-04 MED ORDER — NONFORMULARY OR COMPOUNDED ITEM: 2 refills | Status: DC

## 2022-07-04 NOTE — Telephone Encounter (Signed)
Medication refill request: Clobetasol powder in neutral base, 0.05%, Apply a pea sized amount to the affected area twice a day for one week, then use a pea sized amount 3 times a week.,  Last AEX:  06/28/22 Next AEX: not scheduled  Last MMG (if hormonal medication request): n/a Refill authorized: [facility]ills.

## 2022-07-19 ENCOUNTER — Telehealth: Payer: Self-pay

## 2022-07-19 NOTE — Telephone Encounter (Signed)
Patient needs to schedule bone density appointment.

## 2022-07-21 ENCOUNTER — Other Ambulatory Visit: Payer: Self-pay

## 2022-07-21 DIAGNOSIS — M8580 Other specified disorders of bone density and structure, unspecified site: Secondary | ICD-10-CM

## 2022-08-01 NOTE — Telephone Encounter (Signed)
Rxd phoned in to [facility] by TM on 07/04/2022. Will note pt's preferred pharmacy and close this encounter.

## 2023-02-01 ENCOUNTER — Other Ambulatory Visit: Payer: Commercial Managed Care - HMO

## 2023-03-28 ENCOUNTER — Other Ambulatory Visit: Payer: Self-pay | Admitting: Internal Medicine

## 2023-03-28 DIAGNOSIS — Z1231 Encounter for screening mammogram for malignant neoplasm of breast: Secondary | ICD-10-CM

## 2023-03-29 ENCOUNTER — Other Ambulatory Visit: Payer: Self-pay

## 2023-03-29 DIAGNOSIS — N952 Postmenopausal atrophic vaginitis: Secondary | ICD-10-CM

## 2023-03-29 MED ORDER — ESTRADIOL 0.1 MG/GM VA CREA
TOPICAL_CREAM | VAGINAL | 0 refills | Status: DC
Start: 2023-03-29 — End: 2023-07-11

## 2023-03-29 NOTE — Telephone Encounter (Signed)
 Med refill request: estradiol 0.1 mg vaginal cream Last AEX: 06/28/22 Dr. Oscar La Next AEX: none scheduled Last MMG (if hormonal med) 04/15/22 BI-RADS 1 negative  Refill authorized: estradiol 0.1 mg vaginal cream #1 zero additional refills.  Please approve or deny as appropriate.

## 2023-04-17 ENCOUNTER — Ambulatory Visit
Admission: RE | Admit: 2023-04-17 | Discharge: 2023-04-17 | Disposition: A | Discharge status: Home / Self Care | Source: Ambulatory Visit | Attending: Internal Medicine | Admitting: Internal Medicine

## 2023-04-17 DIAGNOSIS — Z1231 Encounter for screening mammogram for malignant neoplasm of breast: Secondary | ICD-10-CM

## 2023-04-17 HISTORY — DX: Malignant neoplasm of unspecified site of unspecified female breast: C50.919

## 2023-04-19 ENCOUNTER — Ambulatory Visit
Admission: RE | Admit: 2023-04-19 | Discharge: 2023-04-19 | Disposition: A | Discharge status: Home / Self Care | Payer: Commercial Managed Care - HMO | Source: Ambulatory Visit | Attending: Obstetrics and Gynecology | Admitting: Obstetrics and Gynecology

## 2023-04-19 DIAGNOSIS — M8580 Other specified disorders of bone density and structure, unspecified site: Secondary | ICD-10-CM

## 2023-04-20 ENCOUNTER — Encounter: Payer: Self-pay | Admitting: Obstetrics and Gynecology

## 2023-04-21 NOTE — Progress Notes (Signed)
 BS pt

## 2023-04-25 ENCOUNTER — Encounter: Payer: Self-pay | Admitting: Obstetrics and Gynecology

## 2023-07-10 ENCOUNTER — Other Ambulatory Visit: Payer: Self-pay | Admitting: Radiology

## 2023-07-10 DIAGNOSIS — N952 Postmenopausal atrophic vaginitis: Secondary | ICD-10-CM

## 2023-07-11 NOTE — Telephone Encounter (Signed)
 Med refill request: estradiol  0.1 mg vaginal cream Last AEX: 06/28/22 Dr. Jertson Next AEX: 10/18/23 Dr. Nikki Last MMG (if hormonal med) 04/17/23 Bi-RADS 1 negative Refill authorized: Please approve or deny as appropriate.

## 2023-09-21 ENCOUNTER — Other Ambulatory Visit: Payer: Self-pay

## 2023-09-21 NOTE — Telephone Encounter (Signed)
.  Med refill request: Clobetasol  propionate 0.05%  Last AEX:06/28/22 Next AEX:10/18/23 Last MMG (if hormonal med) 04/17/23- BI-RADS CATEGORY 1: Negative.  Refill authorized: Please Advise?

## 2023-09-22 ENCOUNTER — Other Ambulatory Visit: Payer: Self-pay

## 2023-09-22 DIAGNOSIS — N952 Postmenopausal atrophic vaginitis: Secondary | ICD-10-CM

## 2023-09-22 NOTE — Telephone Encounter (Signed)
 Med refill request: estrace   Last AEX: 06/28/22 Next AEX: 10/18/23  Last MMG (if hormonal med) 04/17/23 birads cat 1 neg  Refill authorized: last rx 07/11/23 #42.5g with 0 refills. Please approve or deny

## 2023-09-23 MED ORDER — ESTRADIOL 0.1 MG/GM VA CREA: TOPICAL_CREAM | VAGINAL | 0 refills | Status: DC

## 2023-09-25 MED ORDER — NONFORMULARY OR COMPOUNDED ITEM: 0 refills | Status: DC

## 2023-09-25 NOTE — Telephone Encounter (Signed)
 Phoned in [facility] to [facility].

## 2023-10-18 ENCOUNTER — Encounter: Payer: Self-pay | Admitting: Obstetrics and Gynecology

## 2023-10-18 ENCOUNTER — Ambulatory Visit: Admitting: Obstetrics and Gynecology

## 2023-10-18 VITALS — BP 116/78 | HR 87 | Ht 64.25 in | Wt 149.0 lb

## 2023-10-18 DIAGNOSIS — Z1331 Encounter for screening for depression: Secondary | ICD-10-CM | POA: Diagnosis not present

## 2023-10-18 DIAGNOSIS — N952 Postmenopausal atrophic vaginitis: Secondary | ICD-10-CM

## 2023-10-18 DIAGNOSIS — Z01419 Encounter for gynecological examination (general) (routine) without abnormal findings: Secondary | ICD-10-CM | POA: Diagnosis not present

## 2023-10-18 DIAGNOSIS — N9089 Other specified noninflammatory disorders of vulva and perineum: Secondary | ICD-10-CM

## 2023-10-18 MED ORDER — NONFORMULARY OR COMPOUNDED ITEM: 2 refills | Status: AC

## 2023-10-18 MED ORDER — ESTRADIOL 0.1 MG/GM VA CREA
TOPICAL_CREAM | VAGINAL | 1 refills | Status: AC
Start: 2023-10-18 — End: ?

## 2023-10-18 NOTE — Progress Notes (Signed)
 63 y.o. G18P0101 Married Caucasian female here for annual exam.    Using clobetasol  and vaginal estrogen cream.   Left axillary sensitivity.  Hx left breast cancer.   Right piriformis pain.   Taking Gabapentin  through PCP.    Second marriage.  3 grandchildren and one on the way.   PCP: Cle[place]ilde, [place]  E, PA   Patient's last menstrual period was 03/21/2012.           Sexually active: Yes  The current method of family planning is vasectomy.    Menopausal hormone therapy:  Estrace   Exercising: Yes.    Walk daily, Yoga 2 times a week, and strength training. Smoker:  no  OB History  Gravida Para Term Preterm AB Living  1 1  1  1   SAB IAB Ectopic Multiple Live Births      1    # Outcome Date GA Lbr Len/2nd Weight Sex Type Anes PTL Lv  1 Preterm     M Vag-Spont  Y LIV     HEALTH MAINTENANCE: Last 2 paps:  06/28/22 neg HR HPV neg, 04/01/19 neg History of abnormal Pap or positive HPV:  no Mammogram:   04/17/23 Breast Density Cat B, BIRADS Cat 1 neg  Colonoscopy:  05/22/13 - Done in 07/2023 - Dr. Kristie - due in 10 years per patient.  Bone Density:  04/19/23  Result  osteopenia    Immunization History  Administered Date(s) Administered   Influenza Inj Mdck Quad Pf 10/09/2018, 11/02/2021   Influenza,inj,Quad PF,6+ Mos 11/10/2014, 11/05/2015, 11/15/2016, 10/24/2017   Moderna Covid-19 Fall Seasonal Vaccine 36yrs & older 11/17/2021   Tdap 05/10/2012      reports that she has never smoked. She has never used smokeless tobacco. She reports current alcohol use of about 1.0 - 2.0 standard drink of alcohol per week. She reports that she does not use drugs.  Past Medical History:  Diagnosis Date   BRCA1 negative    BRCA2 negative    Breast cancer (HCC)    Cancer (HCC) 06/2004   LEFT BREAST CANCER.SABRA RADIATION / CHEMO   Lichen sclerosus    NSVD (normal spontaneous vaginal delivery)    Personal history of chemotherapy    Personal history of radiation therapy     Past Surgical  History:  Procedure Laterality Date   BREAST LUMPECTOMY Left    BREAST SURGERY  2006   LEFT LUMPECTOMY   COLONOSCOPY  06/2007   BENIGN CECAL POLYP     Current Outpatient Medications  Medication Sig Dispense Refill   cholecalciferol (VITAMIN D ) 1000 UNITS tablet Take 1,000 Units by mouth daily.     estradiol  (ESTRACE ) 0.1 MG/GM vaginal cream APPLY 1 GRAM VAGINALLY 2 TIMES A WEEK 42.5 g 0   gabapentin  (NEURONTIN ) 300 MG capsule TAKE 1 CAPSULE (300 MG TOTAL) BY MOUTH DAILY AS NEEDED. 30 capsule 0   Melatonin 1 MG CAPS Take by mouth.     NONFORMULARY OR COMPOUNDED ITEM Clobetasol  powder in neutral base, 0.05%, Apply a pea sized amount to the affected area twice a day for one week, then use a pea sized amount 3 times a week. 30 each 0   valACYclovir  (VALTREX ) 500 MG tablet Take 1 tablet twice daily x 3-5 days for outbreak (Patient not taking: Reported on 10/18/2023) 30 tablet 5   No current facility-administered medications for this visit.    ALLERGIES: Clobetasol   Family History  Problem Relation Age of Onset   Cancer Father  LUNG   Hypertension Maternal Aunt    Diabetes Maternal Uncle    Breast cancer Maternal Grandmother 71   Breast cancer Maternal Aunt 45   Pancreatic cancer Maternal Aunt     Review of Systems  See HPI.   PHYSICAL EXAM:  BP 116/78 (BP Location: Right Arm, Patient Position: Sitting, Cuff Size: Normal)   Pulse 87   Ht 5' 4.25 (1.632 m)   Wt 149 lb (67.6 kg)   LMP 03/21/2012   SpO2 99%   BMI 25.38 kg/m     General appearance: alert, cooperative and appears stated age Head: normocephalic, without obvious abnormality, atraumatic Neck: no adenopathy, supple, symmetrical, trachea midline and thyroid normal to inspection and palpation Lungs: clear to auscultation bilaterally Breasts: right - normal appearance, no masses or tenderness, No nipple retraction or dimpling, No nipple discharge or bleeding, No axillary adenopathy Left - scar of left lateral  breast, no masses or tenderness, No nipple retraction or dimpling, No nipple discharge or bleeding, No axillary adenopathy Heart: regular rate and rhythm Abdomen: soft, non-tender; no masses, no organomegaly Extremities: extremities normal, atraumatic, no cyanosis or edema Skin: skin color, texture, turgor normal. No rashes or lesions Lymph nodes: cervical, supraclavicular, and axillary nodes normal. Neurologic: grossly normal  Pelvic: External genitalia:  ring of beefy erythema around introitus.  Left labia minora with pale coloration of tissue.               No abnormal inguinal nodes palpated.              Urethra:  normal appearing urethra with no masses, tenderness or lesions              Bartholins and Skenes: normal                 Vagina: normal appearing vagina with normal color and discharge, no lesions              Cervix: no lesions              Pap taken: no Bimanual Exam:  Uterus:  normal size, contour, position, consistency, mobility, non-tender              Adnexa: no mass, fullness, tenderness              Rectal exam: yes.  Confirms.              Anus:  normal sphincter tone, no lesions  Chaperone was present for exam:  Kari HERO, CMA  ASSESSMENT: Well woman visit with gynecologic exam. Hx breast cancer, 2006.  ER neg, PR pos.  Status post left lumpectomy, XRT, and Tamoxifen .  BRCA 1 and 2 negative.  Vaginal atrophy.  On vaginal estradiol .. Oncology aware.  Vulvar lesion. Lichen sclerosus.  Status post biopsy 2022.    Hx HSV 1 and 2.   No outbreaks.  Osteopenia.  FRAX scores low. PHQ-2-9: 0  PLAN: Mammogram screening discussed. Self breast awareness reviewed. Pap and HRV collected:  no. Due in 2029. Guidelines for Calcium, Vitamin D , regular exercise program including cardiovascular and weight bearing exercise. Medication refills:  Clobetasol  cream.  Estradiol  cream.   Labs with PCP.  Return for vulvar biopsy or biopsies.  Will get copy of colonoscopy from Dr.  Kristie.  Dexa in 2027.  Follow up:  yearly.

## 2023-10-18 NOTE — Patient Instructions (Addendum)
 Calcium in Foods Calcium is a mineral in the body. Of all the minerals in your body, you have the most calcium. Most of the body's calcium supply is stored in bones and teeth. Calcium helps many parts of the body work, including: Blood and blood vessels. Nerves. Hormones. Muscles. Bones and teeth. When your calcium stores are low, you may be at risk for low bone mass, bone loss, and broken bones. When you get enough calcium, it helps to support strong bones and teeth throughout your life. Calcium is especially important for: Children during growth spurts. Females during adolescence. Females who are pregnant or breastfeeding. Females after their menstrual cycle stops (postmenopausal). Females whose menstrual cycle has stopped because of an eating disorder or regular intense exercise. People who can't eat or digest dairy products. People who eat a vegan diet. Recommended daily amounts of calcium: Females (ages 87 to 55): 1,000 mg per day. Females (ages 35 and older): 1,200 mg per day. Males (ages 53 to 45): 1,000 mg per day. Males (ages 43 and older): 1,200 mg per day. Females (ages 88 to 28): 1,300 mg per day. Males (ages 41 to 56): 1,300 mg per day. General information Eat foods that are high in calcium. Try to get most of your calcium from food. Some people may benefit from taking calcium supplements. Check with your health care provider or an expert in healthy eating called a dietitian before starting any calcium supplements. Calcium supplements may interact with certain medicines. Too much calcium may cause other health problems, such as trouble pooping and kidney stones. For the body to absorb calcium, it needs vitamin D. Sources of vitamin D include: Skin exposure to direct sunlight. Foods, such as egg yolks, liver, mushrooms, saltwater fish, and fortified milk. Vitamin D supplements. Check with your provider or dietitian before starting any vitamin D supplements. The amount of  calcium that is absorbed in the body varies with type of food. Talk to a dietitian about what foods are best for you, especially if you are eat a vegan diet or don't eat dairy. What foods are high in calcium?  Foods that are high in calcium contain more than 100 milligrams per serving. Fruits Fortified orange juice or other fruit juice, 300 mg per 8 oz (237 mL) serving. Vegetables Collard greens, 260 mg per 1 cup (130 g) serving, cooked. Kale, 180 mg per 1 cup (118 g) serving, cooked. Bok choy, 180 mg per 1 cup (170 g) serving, cooked Grains Fortified frozen waffles, 200 mg in 2 waffles. Oatmeal, 180 mg in 1 cup (234 g) serving, cooked. Fortified white bread, 175 mg per slice. Meats and other proteins Sardines, canned with bones, 350 mg per 3.75 oz (92 g) serving. Salmon, canned with bones, 168 mg per 3 oz (85 g) serving. Canned shrimp, 125 mg per 3 oz (85 g) serving. Baked beans, 120 mg per 1 cup (266 g) serving. Tofu, firm, made with calcium sulfate, 861 mg per  cup (126 g) serving. Dairy Yogurt, plain, low-fat, 448 mg per 1 cup (245 g) serving Nonfat milk, 300 mg per 1 cup (245 g) serving. American cheese, 145 mg per 1 oz (21 g) serving or 1 slice. Cheddar cheese, 200 mg per 1 oz (28 g) serving or 1 slice. Cottage cheese 2%, 125 mg per  cup (113 g) serving. Fortified soy, rice, or almond milk, 300 mg per 1 cup (237 mL) serving. Mozzarella, part skim, 210 mg per 1 oz (21 g) serving. The items listed  above may not be a complete list of foods high in calcium. Actual amounts of calcium may be different depending on processing. Contact a dietitian for more information. What foods are lower in calcium? Foods that are lower in calcium contain 50 mg or less per serving. Fruits Apple, 1 medium, about 6 mg. Banana, 1 medium, about 12 mg. Vegetables Lettuce, 19 mg per 1 cup (35 g) serving. Tomato, 1 small, about 11 mg. Grains Rice, white, 8 mg per  cup (79 g) serving. Boiled  potatoes, 14 mg per 1 cup (160 g) serving. White bread, 6 mg per slice. Meats and other proteins Egg, 24 mg per 1 egg (50 g). Red meat, 7 mg per 4 oz (80 g) serving. Chicken, 17 mg per 4 oz (113 g) serving. Fish, cod, or trout, 20 mg per 4 oz (140 g) serving. Dairy Cream cheese, regular, 14 mg per 1 Tbsp (15 g) serving. Brie cheese, 50 mg per 1 oz (32 g) serving. The items listed above may not be a complete list of foods lower in calcium. Actual amounts of calcium may be different depending on processing. Contact a dietitian for more information. This information is not intended to replace advice given to you by your health care provider. Make sure you discuss any questions you have with your health care provider. Document Revised: 09/24/2022 Document Reviewed: 09/24/2022 Elsevier Patient Education  2024 [employer]  EXERCISE AND DIET:  We recommended that you start or continue a regular exercise program for good health. Regular exercise means any activity that makes your heart beat faster and makes you sweat.  We recommend exercising at least 30 minutes per day at least 3 days a week, preferably 4 or 5.  We also recommend a diet low in fat and sugar.  Inactivity, poor dietary choices and obesity can cause diabetes, heart attack, stroke, and kidney damage, among others.    ALCOHOL AND SMOKING:  Women should limit their alcohol intake to no more than 7 drinks/beers/glasses of wine (combined, not each!) per week. Moderation of alcohol intake to this level decreases your risk of breast cancer and liver damage. And of course, no recreational drugs are part of a healthy lifestyle.  And absolutely no smoking or even second hand smoke. Most people know smoking can cause heart and lung diseases, but did you know it also contributes to weakening of your bones? Aging of your skin?  Yellowing of your teeth and nails?  CALCIUM AND VITAMIN D:  Adequate intake of calcium and Vitamin D are recommended.  The  recommendations for exact amounts of these supplements seem to change often, but generally speaking 600 mg of calcium (either carbonate or citrate) and 800 units of Vitamin D per day seems prudent. Certain women may benefit from higher intake of Vitamin D.  If you are among these women, your doctor will have told you during your visit.    PAP SMEARS:  Pap smears, to check for cervical cancer or precancers,  have traditionally been done yearly, although recent scientific advances have shown that most women can have pap smears less often.  However, every woman still should have a physical exam from her gynecologist every year. It will include a breast check, inspection of the vulva and vagina to check for abnormal growths or skin changes, a visual exam of the cervix, and then an exam to evaluate the size and shape of the uterus and ovaries.  And after 62 years of age, a rectal exam  is indicated to check for rectal cancers. We will also provide age appropriate advice regarding health maintenance, like when you should have certain vaccines, screening for sexually transmitted diseases, bone density testing, colonoscopy, mammograms, etc.   MAMMOGRAMS:  All women over 11 years old should have a yearly mammogram. Many facilities now offer a "3D" mammogram, which may cost around $50 extra out of pocket. If possible,  we recommend you accept the option to have the 3D mammogram performed.  It both reduces the number of women who will be called back for extra views which then turn out to be normal, and it is better than the routine mammogram at detecting truly abnormal areas.    COLONOSCOPY:  Colonoscopy to screen for colon cancer is recommended for all women at age 8.  We know, you hate the idea of the prep.  We agree, BUT, having colon cancer and not knowing it is worse!!  Colon cancer so often starts as a polyp that can be seen and removed at colonscopy, which can quite literally save your life!  And if your first  colonoscopy is normal and you have no family history of colon cancer, most women don't have to have it again for 10 years.  Once every ten years, you can do something that may end up saving your life, right?  We will be happy to help you get it scheduled when you are ready.  Be sure to check your insurance coverage so you understand how much it will cost.  It may be covered as a preventative service at no cost, but you should check your particular policy.

## 2023-10-23 ENCOUNTER — Ambulatory Visit (INDEPENDENT_AMBULATORY_CARE_PROVIDER_SITE_OTHER): Admitting: Obstetrics and Gynecology

## 2023-10-23 ENCOUNTER — Encounter: Payer: Self-pay | Admitting: Obstetrics and Gynecology

## 2023-10-23 ENCOUNTER — Other Ambulatory Visit (HOSPITAL_COMMUNITY)
Admission: RE | Admit: 2023-10-23 | Discharge: 2023-10-23 | Disposition: A | Discharge status: Home / Self Care | Source: Ambulatory Visit | Attending: Obstetrics and Gynecology | Admitting: Obstetrics and Gynecology

## 2023-10-23 VITALS — BP 120/72 | HR 95

## 2023-10-23 DIAGNOSIS — N9089 Other specified noninflammatory disorders of vulva and perineum: Secondary | ICD-10-CM

## 2023-10-23 NOTE — Progress Notes (Signed)
 GYNECOLOGY  VISIT   HPI: 63 y.o.   Married  Caucasian female   G1P0101 with Patient's last menstrual period was 03/21/2012.   here for: Vulvar biopsy    Treated for lichen sclerosus.  Biopsy of left vulva previously confirmed lichen sclerosus.  Biopsy of the right vulvar previously showed inflamed squamous mucosa.  Noted to have ring of erythema around introitus at visit on 10/18/23.     GYNECOLOGIC HISTORY: Patient's last menstrual period was 03/21/2012. Contraception:  PMP Menopausal hormone therapy:  estrace   Last 2 paps:  06/28/22 neg HR HPV neg, 04/01/19 neg  History of abnormal Pap or positive HPV:  no Mammogram:  04/17/23 Breast Density Cat B, BIRADS Cat 1 neg         OB History     Gravida  1   Para  1   Term      Preterm  1   AB      Living  1      SAB      IAB      Ectopic      Multiple      Live Births  1              Patient Active Problem List   Diagnosis Date Noted   Screening for malignant neoplasm of colon 06/28/2022   Internal hemorrhoids 06/28/2022   Lichen sclerosus 09/04/2020   Hematuria of undiagnosed cause 11/18/2015   Breast cancer, left breast (HCC) 04/24/2014    Past Medical History:  Diagnosis Date   BRCA1 negative    BRCA2 negative    Breast cancer (HCC)    Cancer (HCC) 06/2004   LEFT BREAST CANCER.SABRA RADIATION / CHEMO   Lichen sclerosus    NSVD (normal spontaneous vaginal delivery)    Personal history of chemotherapy    Personal history of radiation therapy     Past Surgical History:  Procedure Laterality Date   BREAST LUMPECTOMY Left    BREAST SURGERY  2006   LEFT LUMPECTOMY   COLONOSCOPY  06/2007   BENIGN CECAL POLYP     Current Outpatient Medications  Medication Sig Dispense Refill   cholecalciferol (VITAMIN D ) 1000 UNITS tablet Take 1,000 Units by mouth daily.     estradiol  (ESTRACE ) 0.1 MG/GM vaginal cream APPLY 1 GRAM VAGINALLY 2 TIMES A WEEK 42.5 g 1   gabapentin  (NEURONTIN ) 300 MG capsule TAKE 1  CAPSULE (300 MG TOTAL) BY MOUTH DAILY AS NEEDED. 30 capsule 0   Melatonin 1 MG CAPS Take by mouth.     NONFORMULARY OR COMPOUNDED ITEM Clobetasol  powder in neutral base, 0.05%, Apply a pea sized amount to the affected area twice a day for one week, then use a pea sized amount 3 times a week.  [facility]. 30 each 2   valACYclovir  (VALTREX ) 500 MG tablet Take 1 tablet twice daily x 3-5 days for outbreak (Patient not taking: Reported on 10/23/2023) 30 tablet 5   No current facility-administered medications for this visit.     ALLERGIES: Clobetasol   Family History  Problem Relation Age of Onset   Cancer Father        LUNG   Hypertension Maternal Aunt    Diabetes Maternal Uncle    Breast cancer Maternal Grandmother 70   Breast cancer Maternal Aunt 45   Pancreatic cancer Maternal Aunt     Social History   Socioeconomic History   Marital status: Married    Spouse name: Not on file  Number of children: Not on file   Years of education: Not on file   Highest education level: Not on file  Occupational History   Not on file  Tobacco Use   Smoking status: Never   Smokeless tobacco: Never  Vaping Use   Vaping status: Never Used  Substance and Sexual Activity   Alcohol use: Yes    Alcohol/week: 1.0 - 2.0 standard drink of alcohol    Types: 1 - 2 Standard drinks or equivalent per week    Comment: WINE    Drug use: No   Sexual activity: Yes    Partners: Male    Birth control/protection: Post-menopausal, Other-see comments    Comment: Vasectomy  Other Topics Concern   Not on file  Social History Narrative   Not on file   Social Drivers of Health   Financial Resource Strain: Not on file  Food Insecurity: Not on file  Transportation Needs: Not on file  Physical Activity: Not on file  Stress: Not on file  Social Connections: Not on file  Intimate Partner Violence: Not on file    Review of Systems  All other systems reviewed and are negative.   PHYSICAL  EXAMINATION:   BP 120/72 (BP Location: Left Arm, Patient Position: Sitting)   Pulse 95   LMP 03/21/2012   SpO2 96%     General appearance: alert, cooperative and appears stated age  Pelvic: External genitalia:  ring of beefy red flat erythema of the right labia minora and introitus.  Left labia minora with flat white skin color change.              Urethra:  normal appearing urethra with no masses, tenderness or lesions    Vulvar biopsy  Consent and time out done.  Prep with Hibiclens.  Local 1% lidocaine, lot [id number], exp Feb, 2028.  4 mm punch biopsy done of the right inferior labia minora.   Tissue to pathology.  Silver nitrate applied.  Minimal EBL.  No complications.  Chaperone was present for exam:  Kari HERO, CMA  ASSESSMENT:  Labia lesion.  Hx lichen sclerosus.   PLAN:  FU biopsy result.  Post biopsy care reviewed.  Final plan to follow.

## 2023-10-23 NOTE — Patient Instructions (Signed)
 Vulva Biopsy, Care After After a vulva biopsy, it is common to have: Slight bleeding from the biopsy site. Soreness or slight pain at the biopsy site. Follow these instructions at home: Your doctor may give you more instructions. If you have problems, contact your doctor. Biopsy site care  Follow instructions from your doctor about how to take care of your biopsy site. Make sure you: Clean the area using water and mild soap two times a day or as told by your doctor. Gently pat the area dry. You may shower 24 hours after the procedure. If you were prescribed an antibiotic ointment, apply it as told by your doctor. Do not stop using the antibiotic even if your condition gets better. If told by your doctor, take a warm water bath (sitz bath) to help with pain and soreness. A sitz bath is taken while you are sitting down. Do this as often as told by your doctor. The water should only come up to your hips and cover your butt. You may pat the area dry with a soft, clean towel. Leave stitches (sutures) or skin glue in place for at least 2 weeks. Leave tape strips alone unless you are told to take them off. You may trim the edges of the tape strips if they curl up. Check your biopsy area every day for signs of infection. It may be helpful to use a handheld mirror to do this. Check for: Redness, swelling, or more pain. More fluid or blood. Warmth. Pus or a bad smell. Do not rub the biopsy area after peeing (urinating). Gently pat the area dry, or use a bottle filled with warm water (peri bottle) to clean the area. Gently wipe from front to back. Lifestyle Wear loose, cotton underwear. Do not wear tight pants. For at least 1 week or until your doctor says it is okay: Do not use a tampon, douche, or put anything in your vagina. Do not have sex. Until your doctor says it is okay: Do not exercise. Do not swim or use a hot tub. General instructions Take over-the-counter and prescription  medicines only as told by your doctor. Drink enough fluid to keep your pee (urine) pale yellow. Use a sanitary pad until the bleeding stops. If told, put ice on the biopsy site. To do this: Place ice in a plastic bag. Place a towel between your skin and the bag. Leave the ice on for 20 minutes, 2-3 times a day. Take off the ice if your skin turns bright red. This is very important. If you cannot feel pain, heat, or cold, you have a greater risk of damage to the area. Keep all follow-up visits. Contact a doctor if: You have redness, swelling, or more pain around your biopsy site. You have more fluid or blood coming from your biopsy site. Your biopsy site feels warm when you touch it. Medicines or ice packs do not help with your pain. You have a fever or chills. Get help right away if: You have a lot of bleeding from the vulva. You have pus or a bad smell coming from your biopsy site. You have pain in your belly (abdomen). Summary After the procedure, it is common to have slight bleeding and soreness at the biopsy site. Follow all instructions as told by your doctor. Take sitz baths as told by your doctor. Leave any stitches in place. Check your biopsy site for infection. Signs include redness, swelling, more pain, more fluid or blood, or warmth. Get help  right away if you have a lot of bleeding, pus or a bad smell, or pain in your belly. This information is not intended to replace advice given to you by your health care provider. Make sure you discuss any questions you have with your health care provider. Document Revised: 09/15/2020 Document Reviewed: 09/15/2020 Elsevier Patient Education  2024 [employer].

## 2023-10-25 ENCOUNTER — Ambulatory Visit: Payer: Self-pay | Admitting: Obstetrics and Gynecology

## 2023-10-25 LAB — SURGICAL PATHOLOGY

## 2024-10-23 ENCOUNTER — Ambulatory Visit: Admitting: Obstetrics and Gynecology
